# Patient Record
Sex: Male | Born: 1965 | Race: White | Hispanic: No | Marital: Married | Smoking: Current every day smoker
Health system: Southern US, Community
[De-identification: ages and names within clinical notes are randomized; demographics above are authoritative.]

## PROBLEM LIST (undated history)

## (undated) DIAGNOSIS — Z87442 Personal history of urinary calculi: Secondary | ICD-10-CM

## (undated) DIAGNOSIS — J189 Pneumonia, unspecified organism: Secondary | ICD-10-CM

## (undated) DIAGNOSIS — C801 Malignant (primary) neoplasm, unspecified: Secondary | ICD-10-CM

---

## 2000-10-30 ENCOUNTER — Ambulatory Visit (HOSPITAL_COMMUNITY): Admission: RE | Admit: 2000-10-30 | Discharge: 2000-10-30 | Payer: Self-pay | Admitting: Internal Medicine

## 2000-10-30 ENCOUNTER — Encounter: Payer: Self-pay | Admitting: Internal Medicine

## 2001-12-14 ENCOUNTER — Emergency Department (HOSPITAL_COMMUNITY): Admission: EM | Admit: 2001-12-14 | Discharge: 2001-12-15 | Payer: Self-pay | Admitting: *Deleted

## 2001-12-15 ENCOUNTER — Emergency Department (HOSPITAL_COMMUNITY): Admission: EM | Admit: 2001-12-15 | Discharge: 2001-12-15 | Payer: Self-pay | Admitting: Emergency Medicine

## 2003-12-31 ENCOUNTER — Emergency Department (HOSPITAL_COMMUNITY): Admission: EM | Admit: 2003-12-31 | Discharge: 2003-12-31 | Payer: Self-pay | Admitting: Emergency Medicine

## 2005-10-21 ENCOUNTER — Emergency Department (HOSPITAL_COMMUNITY): Admission: EM | Admit: 2005-10-21 | Discharge: 2005-10-21 | Payer: Self-pay | Admitting: Emergency Medicine

## 2005-12-18 ENCOUNTER — Emergency Department (HOSPITAL_COMMUNITY): Admission: EM | Admit: 2005-12-18 | Discharge: 2005-12-18 | Payer: Self-pay | Admitting: Emergency Medicine

## 2008-03-12 ENCOUNTER — Emergency Department (HOSPITAL_COMMUNITY): Admission: EM | Admit: 2008-03-12 | Discharge: 2008-03-12 | Payer: Self-pay | Admitting: Emergency Medicine

## 2008-05-01 ENCOUNTER — Emergency Department (HOSPITAL_COMMUNITY): Admission: EM | Admit: 2008-05-01 | Discharge: 2008-05-01 | Payer: Self-pay | Admitting: Emergency Medicine

## 2009-05-04 ENCOUNTER — Emergency Department (HOSPITAL_COMMUNITY): Admission: EM | Admit: 2009-05-04 | Discharge: 2009-05-04 | Payer: Self-pay | Admitting: Emergency Medicine

## 2010-10-25 LAB — POCT I-STAT, CHEM 8
BUN: 16 mg/dL (ref 6–23)
Calcium, Ion: 1.14 mmol/L (ref 1.12–1.32)
Chloride: 109 mEq/L (ref 96–112)
Creatinine, Ser: 0.7 mg/dL (ref 0.4–1.5)
Glucose, Bld: 127 mg/dL — ABNORMAL HIGH (ref 70–99)
HCT: 48 % (ref 39.0–52.0)
Hemoglobin: 16.3 g/dL (ref 13.0–17.0)
Potassium: 3.6 mEq/L (ref 3.5–5.1)
Sodium: 141 mEq/L (ref 135–145)
TCO2: 22 mmol/L (ref 0–100)

## 2010-10-25 LAB — URINALYSIS, ROUTINE W REFLEX MICROSCOPIC
Bilirubin Urine: NEGATIVE
Glucose, UA: NEGATIVE mg/dL
Ketones, ur: NEGATIVE mg/dL
Leukocytes, UA: NEGATIVE
Nitrite: NEGATIVE
Protein, ur: NEGATIVE mg/dL
Specific Gravity, Urine: >1.03 — ABNORMAL HIGH (ref 1.005–1.030)
Urobilinogen, UA: 1 mg/dL (ref 0.0–1.0)
pH: 6 (ref 5.0–8.0)

## 2010-10-25 LAB — URINE MICROSCOPIC-ADD ON

## 2011-04-22 LAB — STREP A DNA PROBE: Group A Strep Probe: NEGATIVE

## 2011-04-22 LAB — RAPID STREP SCREEN (MED CTR MEBANE ONLY): Streptococcus, Group A Screen (Direct): NEGATIVE

## 2013-04-03 ENCOUNTER — Encounter (HOSPITAL_COMMUNITY): Payer: Self-pay | Admitting: *Deleted

## 2013-04-03 ENCOUNTER — Emergency Department (HOSPITAL_COMMUNITY)
Admission: EM | Admit: 2013-04-03 | Discharge: 2013-04-03 | Disposition: A | Discharge status: Home / Self Care | Payer: Self-pay | Attending: Emergency Medicine | Admitting: Emergency Medicine

## 2013-04-03 DIAGNOSIS — B019 Varicella without complication: Secondary | ICD-10-CM | POA: Insufficient documentation

## 2013-04-03 DIAGNOSIS — M79609 Pain in unspecified limb: Secondary | ICD-10-CM | POA: Insufficient documentation

## 2013-04-03 DIAGNOSIS — F17200 Nicotine dependence, unspecified, uncomplicated: Secondary | ICD-10-CM | POA: Insufficient documentation

## 2013-04-03 MED ORDER — HYDROCODONE-ACETAMINOPHEN 5-325 MG PO TABS: 1.0000 | ORAL_TABLET | ORAL | Status: DC | PRN

## 2013-04-03 MED ORDER — VALACYCLOVIR HCL 1 G PO TABS: ORAL_TABLET | ORAL | Status: DC

## 2013-04-03 NOTE — ED Provider Notes (Signed)
CSN: [id number]     Arrival date & time 04/03/13  0913 History   First MD Initiated Contact with Patient 04/03/13 [phone]     Chief Complaint  Patient presents with  . Arm Pain  . Rash   (Consider location/radiation/quality/duration/timing/severity/associated sxs/prior Treatment) Patient is a 47 y.o. male presenting with arm pain and rash. The history is provided by the patient.  Arm Pain This is a new problem. The current episode started in the past 7 days. The problem occurs constantly. The problem has been gradually worsening. Associated symptoms include a rash. Pertinent negatives include no abdominal pain, chills, fever, headaches, nausea, neck pain, sore throat or vomiting. He has tried NSAIDs for the symptoms. The treatment provided no relief.  Rash Associated symptoms: no abdominal pain, no fever, no headaches, no nausea, no sore throat and not vomiting    Andre Donovan is a 47 y.o. male who presents to the ED with a rash. He states that a few days ago he felt a tingling sensation and then the next day noted a rash in the right axilla. Today the rash is slightly worse and the pain is increasing in the are of the rash. He describes the pain as burning, tingling.   History reviewed. No pertinent past medical history. History reviewed. No pertinent past surgical history. History reviewed. No pertinent family history. History  Substance Use Topics  . Smoking status: Current Every Day Smoker -- 2.00 packs/day  . Smokeless tobacco: Not on file  . Alcohol Use: No    Review of Systems  Constitutional: Negative for fever and chills.  HENT: Negative for sore throat and neck pain.   Gastrointestinal: Negative for nausea, vomiting and abdominal pain.  Genitourinary: Negative for urgency and frequency.  Musculoskeletal:       Right axilla pain and rash  Skin: Positive for rash.  Neurological: Negative for dizziness and headaches.  Psychiatric/Behavioral: The patient is not  nervous/anxious.     Allergies  Review of patient's allergies indicates no known allergies.  Home Medications  No current outpatient prescriptions on file. BP 126/88  Pulse 87  Temp(Src) 98.5 F (36.9 C) (Oral)  Resp 17  Ht 6' (1.829 m)  Wt 250 lb (113.399 kg)  BMI 33.9 kg/m2  SpO2 94% Physical Exam  Nursing note and vitals reviewed. Constitutional: He is oriented to person, place, and time. He appears well-developed and well-nourished. No distress.  HENT:  Head: Normocephalic and atraumatic.  Mouth/Throat: Uvula is midline, oropharynx is clear and moist and mucous membranes are normal.  Eyes: Conjunctivae and EOM are normal. Pupils are equal, round, and reactive to light.  Neck: Neck supple.  Cardiovascular: Normal rate, regular rhythm and normal heart sounds.   Pulmonary/Chest: Effort normal and breath sounds normal.  Abdominal: Soft. There is no tenderness.  Musculoskeletal: Normal range of motion.       Arms: There is vesicular rash that begins in the right axilla that extends to the right chest wall and down the right arm palmar aspect. Dermatome T1  Neurological: He is alert and oriented to person, place, and time. He has normal strength. No cranial nerve deficit or sensory deficit.  Skin: Skin is warm and dry.  Psychiatric: He has a normal mood and affect. His behavior is normal.    ED Course  Procedures  MDM  48 y.o. male with vesicular rash right chest, axilla and forearm consistent with herpes zoster Will treat with antiviral medications and pain management. Since patient has  some relief with Advil and only complains of slight burning will no treat with steroids since they are controversial. Discussed with the patient clinical findings and plan of care. All questioned fully answered. He will return if any problems arise. Patient stable for discharge without any immediate complications.     Medication List         HYDROcodone-acetaminophen 5-325 MG per tablet    Commonly known as:  NORCO/VICODIN  Take 1 tablet by mouth every 4 (four) hours as needed.     valACYclovir 1000 MG tablet  Commonly known as:  VALTREX  Take one 1,000 mg three times a day for 7 days           Janne Napoleon, NP 04/03/13 1022

## 2013-04-03 NOTE — ED Provider Notes (Signed)
Medical screening examination/treatment/procedure(s) were performed by non-physician practitioner and as supervising physician I was immediately available for consultation/collaboration. Devoria Albe, MD, Armando Gang   Ward Givens, MD 04/03/13 [phone]

## 2013-04-03 NOTE — ED Notes (Signed)
Two days ago began feeling tingling/stinging pain in R axilla and felt knot in axilla.  Yesterday broke out in rash.  Rash begins anterior R axilla, follows dermatomal pattern down inner aspect R arm.  Vesicles on reddened base, non-puritic.

## 2017-05-16 ENCOUNTER — Emergency Department (HOSPITAL_COMMUNITY)
Admission: EM | Admit: 2017-05-16 | Discharge: 2017-05-16 | Disposition: A | Discharge status: Home / Self Care | Payer: Self-pay | Attending: Emergency Medicine | Admitting: Emergency Medicine

## 2017-05-16 ENCOUNTER — Encounter (HOSPITAL_COMMUNITY): Payer: Self-pay | Admitting: Emergency Medicine

## 2017-05-16 DIAGNOSIS — Y9389 Activity, other specified: Secondary | ICD-10-CM | POA: Insufficient documentation

## 2017-05-16 DIAGNOSIS — Y999 Unspecified external cause status: Secondary | ICD-10-CM | POA: Insufficient documentation

## 2017-05-16 DIAGNOSIS — H109 Unspecified conjunctivitis: Secondary | ICD-10-CM

## 2017-05-16 DIAGNOSIS — H1031 Unspecified acute conjunctivitis, right eye: Secondary | ICD-10-CM | POA: Insufficient documentation

## 2017-05-16 DIAGNOSIS — X58XXXA Exposure to other specified factors, initial encounter: Secondary | ICD-10-CM | POA: Insufficient documentation

## 2017-05-16 DIAGNOSIS — Y9289 Other specified places as the place of occurrence of the external cause: Secondary | ICD-10-CM | POA: Insufficient documentation

## 2017-05-16 DIAGNOSIS — F17210 Nicotine dependence, cigarettes, uncomplicated: Secondary | ICD-10-CM | POA: Insufficient documentation

## 2017-05-16 MED ORDER — TETRACAINE HCL 0.5 % OP SOLN
1.0000 [drp] | Freq: Once | OPHTHALMIC | Status: AC
  Administered 2017-05-16: 1 [drp] via OPHTHALMIC

## 2017-05-16 MED ORDER — TOBRAMYCIN 0.3 % OP SOLN
2.0000 [drp] | Freq: Once | OPHTHALMIC | Status: AC
  Administered 2017-05-16: 2 [drp] via OPHTHALMIC
  Filled 2017-05-16: qty 5

## 2017-05-16 MED ORDER — TETRACAINE HCL 0.5 % OP SOLN
OPHTHALMIC | Status: AC
  Administered 2017-05-16: 12:00:00 1 [drp] via OPHTHALMIC
  Filled 2017-05-16: qty 4

## 2017-05-16 MED ORDER — FLUORESCEIN SODIUM 1 MG OP STRP
1.0000 | ORAL_STRIP | Freq: Once | OPHTHALMIC | Status: AC
  Administered 2017-05-16: 1 via OPHTHALMIC
  Filled 2017-05-16: qty 1

## 2017-05-16 NOTE — ED Triage Notes (Signed)
Patient states he was cleaning out gutters yesterday and feels like he got something in his right eye.

## 2017-05-16 NOTE — Discharge Instructions (Signed)
Vital signs within normal limits.  Your examination shows conjunctivitis/inflammation of your eye.  Please use cool compresses.  Please use dark glasses and a hat with a brim.  Try to avoid sudden light changes (light to dark, dark to light).  Please wash hands frequently.  This may be contagious, please keep your distance from others until it clears.  Use 2 drops of tobramycin to your right eye every 4 hours over the next 5 days.  Use the cool compresses 3 or 4 times daily.

## 2017-05-16 NOTE — ED Provider Notes (Signed)
[facility] EMERGENCY DEPARTMENT Provider Note   CSN: [id number] Arrival date & time: 05/16/17  1028     History   Chief Complaint Chief Complaint  Patient presents with  . Eye Injury    HPI Kateri PlummerBruce D Basso is a 51 y.o. male.  Patient is a 51 year old male who presents to the emergency department with a complaint of problem with his eye.  The patient states that on yesterday October 25 he was cleaning out a gutter when he felt like something went in his eye.  He states since that time he has been having increasing redness and irritation.  This morning he noted increased mucus in the eyelashes.  He states that he has some pain when he changes from light to dark areas.  No recent surgery or operations or procedures involving his eyes.  No loss of vision reported since this incident.  He presents now for assistance.      History reviewed. No pertinent past medical history.  There are no active problems to display for this patient.   History reviewed. No pertinent surgical history.     Home Medications    Prior to Admission medications   Medication Sig Start Date End Date Taking? Authorizing Provider  HYDROcodone-acetaminophen (NORCO/VICODIN) 5-325 MG per tablet Take 1 tablet by mouth every 4 (four) hours as needed. 04/03/13   Janne NapoleonNeese, Hope M, NP  valACYclovir (VALTREX) 1000 MG tablet Take one 1,000 mg three times a day for 7 days 04/03/13   Janne NapoleonNeese, Hope M, NP    Family History History reviewed. No pertinent family history.  Social History Social History  Substance Use Topics  . Smoking status: Current Every Day Smoker    Packs/day: 2.00  . Smokeless tobacco: Never Used  . Alcohol use Yes     Comment: occasionally     Allergies   Patient has no known allergies.   Review of Systems Review of Systems  Constitutional: Negative for activity change.       All ROS Neg except as noted in HPI  HENT: Negative for nosebleeds.   Eyes: Positive for photophobia,  discharge and redness.  Respiratory: Negative for cough, shortness of breath and wheezing.   Cardiovascular: Negative for chest pain and palpitations.  Gastrointestinal: Negative for abdominal pain and blood in stool.  Genitourinary: Negative for dysuria, frequency and hematuria.  Musculoskeletal: Negative for arthralgias, back pain and neck pain.  Skin: Negative.   Neurological: Negative for dizziness, seizures and speech difficulty.  Psychiatric/Behavioral: Negative for confusion and hallucinations.     Physical Exam Updated Vital Signs BP (!) 147/87 (BP Location: Right Arm)   Pulse 80   Temp 98.5 F (36.9 C) (Oral)   Resp 18   Ht 6' (1.829 m)   Wt 99.8 kg (220 lb)   SpO2 97%   BMI 29.84 kg/m   Physical Exam  Constitutional: He is oriented to person, place, and time. He appears well-developed and well-nourished.  Non-toxic appearance.  HENT:  Head: Normocephalic.  Right Ear: Tympanic membrane and external ear normal.  Left Ear: Tympanic membrane and external ear normal.  Eyes: Pupils are equal, round, and reactive to light. EOM and lids are normal. Lids are everted and swept, no foreign bodies found. Right eye exhibits no hordeolum. No foreign body present in the right eye. Left eye exhibits no hordeolum. No foreign body present in the left eye. Right conjunctiva is injected. Right conjunctiva has no hemorrhage. Left conjunctiva is not injected. Left conjunctiva has  no hemorrhage. No scleral icterus.  Fundoscopic exam:      The right eye shows no AV nicking, no hemorrhage and no papilledema.       The left eye shows no AV nicking, no hemorrhage and no papilledema.  Slit lamp exam:      The right eye shows no corneal flare, no corneal ulcer, no foreign body, no hyphema, no fluorescein uptake and no anterior chamber bulge.  Neck: Normal range of motion. Neck supple. Carotid bruit is not present.  Cardiovascular: Normal rate, regular rhythm, normal heart sounds, intact distal  pulses and normal pulses.   Pulmonary/Chest: Breath sounds normal. No respiratory distress.  Abdominal: Soft. Bowel sounds are normal. There is no tenderness. There is no guarding.  Musculoskeletal: Normal range of motion.  Lymphadenopathy:       Head (right side): No submandibular adenopathy present.       Head (left side): No submandibular adenopathy present.    He has no cervical adenopathy.  Neurological: He is alert and oriented to person, place, and time. He has normal strength. No cranial nerve deficit or sensory deficit.  Skin: Skin is warm and dry.  Psychiatric: He has a normal mood and affect. His speech is normal.  Nursing note and vitals reviewed.    ED Treatments / Results  Labs (all labs ordered are listed, but only abnormal results are displayed) Labs Reviewed - No data to display  EKG  EKG Interpretation None       Radiology No results found.  Procedures Procedures (including critical care time)  Medications Ordered in ED Medications  fluorescein ophthalmic strip 1 strip (1 strip Right Eye Given by Other 05/16/17 1145)  tetracaine (PONTOCAINE) 0.5 % ophthalmic solution 1 drop (1 drop Right Eye Given by Other 05/16/17 1143)  tobramycin (TOBREX) 0.3 % ophthalmic solution 2 drop (2 drops Right Eye Given 05/16/17 1202)     Initial Impression / Assessment and Plan / ED Course  I have reviewed the triage vital signs and the nursing notes.  Pertinent labs & imaging results that were available during my care of the patient were reviewed by me and considered in my medical decision making (see chart for details).       Final Clinical Impressions(s) / ED Diagnoses MDM Vital signs within normal limits.  Patient was examined with slit lamp.  No foreign body appreciated.  The bulbar conjunctiva is inflamed.  The conjunctiva is inflamed.  There is no anterior chamber bulge appreciated.  Suspect conjunctivitis.  Patient will be treated with cool compresses,  tobramycin eyedrops.  The patient is to see his ophthalmology specialist or return to the emergency department if not improving.   Final diagnoses:  None    New Prescriptions New Prescriptions   No medications on file     Ivery Quale, Cordelia Poche 05/16/17 1218    Samuel Jester, DO 05/20/17 1546

## 2018-02-13 ENCOUNTER — Encounter (HOSPITAL_COMMUNITY): Payer: Self-pay | Admitting: Emergency Medicine

## 2018-02-13 ENCOUNTER — Emergency Department (HOSPITAL_COMMUNITY)
Admission: EM | Admit: 2018-02-13 | Discharge: 2018-02-13 | Disposition: A | Discharge status: Home / Self Care | Payer: Self-pay | Attending: Emergency Medicine | Admitting: Emergency Medicine

## 2018-02-13 ENCOUNTER — Other Ambulatory Visit: Payer: Self-pay

## 2018-02-13 ENCOUNTER — Emergency Department (HOSPITAL_COMMUNITY): Payer: Self-pay

## 2018-02-13 DIAGNOSIS — N2889 Other specified disorders of kidney and ureter: Secondary | ICD-10-CM | POA: Insufficient documentation

## 2018-02-13 DIAGNOSIS — Z79899 Other long term (current) drug therapy: Secondary | ICD-10-CM | POA: Insufficient documentation

## 2018-02-13 DIAGNOSIS — F17200 Nicotine dependence, unspecified, uncomplicated: Secondary | ICD-10-CM | POA: Insufficient documentation

## 2018-02-13 DIAGNOSIS — N201 Calculus of ureter: Secondary | ICD-10-CM | POA: Insufficient documentation

## 2018-02-13 LAB — I-STAT CHEM 8, ED
BUN: 19 mg/dL (ref 6–20)
Calcium, Ion: 1.24 mmol/L (ref 1.15–1.40)
Chloride: 106 mmol/L (ref 98–111)
Creatinine, Ser: 1 mg/dL (ref 0.61–1.24)
Glucose, Bld: 151 mg/dL — ABNORMAL HIGH (ref 70–99)
HEMATOCRIT: 44 % (ref 39.0–52.0)
Hemoglobin: 15 g/dL (ref 13.0–17.0)
Potassium: 4 mmol/L (ref 3.5–5.1)
Sodium: 140 mmol/L (ref 135–145)
TCO2: 22 mmol/L (ref 22–32)

## 2018-02-13 MED ORDER — ONDANSETRON 8 MG PO TBDP: 8.0000 mg | ORAL_TABLET | Freq: Once | ORAL | Status: DC

## 2018-02-13 MED ORDER — ONDANSETRON HCL 4 MG/2ML IJ SOLN
4.0000 mg | Freq: Once | INTRAMUSCULAR | Status: AC
  Administered 2018-02-13: 4 mg via INTRAVENOUS
  Filled 2018-02-13: qty 2

## 2018-02-13 MED ORDER — TAMSULOSIN HCL 0.4 MG PO CAPS: ORAL_CAPSULE | ORAL | 0 refills | Status: DC

## 2018-02-13 MED ORDER — NAPROXEN 500 MG PO TABS: ORAL_TABLET | ORAL | 0 refills | Status: DC

## 2018-02-13 MED ORDER — KETOROLAC TROMETHAMINE 30 MG/ML IJ SOLN: 60.0000 mg | Freq: Once | INTRAMUSCULAR | Status: DC

## 2018-02-13 MED ORDER — KETOROLAC TROMETHAMINE 30 MG/ML IJ SOLN
30.0000 mg | Freq: Once | INTRAMUSCULAR | Status: AC
  Administered 2018-02-13: 30 mg via INTRAVENOUS
  Filled 2018-02-13: qty 1

## 2018-02-13 MED ORDER — ONDANSETRON HCL 4 MG PO TABS: 4.0000 mg | ORAL_TABLET | Freq: Three times a day (TID) | ORAL | 0 refills | Status: DC | PRN

## 2018-02-13 MED ORDER — OXYCODONE-ACETAMINOPHEN 5-325 MG PO TABS: 1.0000 | ORAL_TABLET | Freq: Four times a day (QID) | ORAL | 0 refills | Status: DC | PRN

## 2018-02-13 NOTE — ED Provider Notes (Signed)
[facility] EMERGENCY DEPARTMENT Provider Note   CSN: [id number] Arrival date & time: 02/13/18  0440  Time seen 05:24 AM   History   Chief Complaint Chief Complaint  Patient presents with  . Flank Pain    HPI Andre Donovan is a 52 y.o. male.  HPI patient reports about 3 AM he woke up with right-sided flank pain is radiating to his right mid abdomen.  He states the pain is constant and he describes it as a pressure.  He states nothing he does makes it feel worse, nothing he does makes it feel better.  He states he cannot find a comfortable position.  He has had nausea without vomiting.  He denies hematuria or dysuria.  He states he is never had this before.  He states he works out in the heat and he does drink a lot of caffeine drinks.  He denies any known family history of kidney stones.  PCP Patient, No Pcp Per   History reviewed. No pertinent past medical history.  There are no active problems to display for this patient.   History reviewed. No pertinent surgical history.      Home Medications    None  Prior to Admission medications   Medication Sig Start Date End Date Taking? Authorizing Provider  HYDROcodone-acetaminophen (NORCO/VICODIN) 5-325 MG per tablet Take 1 tablet by mouth every 4 (four) hours as needed. 04/03/13   Janne Napoleon, NP  naproxen (NAPROSYN) 500 MG tablet Take 1 po BID with food prn pain 02/13/18   Devoria Albe, MD  ondansetron (ZOFRAN) 4 MG tablet Take 1 tablet (4 mg total) by mouth every 8 (eight) hours as needed for nausea or vomiting. 02/13/18   Devoria Albe, MD  oxyCODONE-acetaminophen (PERCOCET/ROXICET) 5-325 MG tablet Take 1 tablet by mouth every 6 (six) hours as needed for severe pain. 02/13/18   Devoria Albe, MD  tamsulosin (FLOMAX) 0.4 MG CAPS capsule Take 1 po QD until you pass the stone. 02/13/18   Devoria Albe, MD  valACYclovir (VALTREX) 1000 MG tablet Take one 1,000 mg three times a day for 7 days 04/03/13   Janne Napoleon, NP    Family  History History reviewed. No pertinent family history.  Social History Social History   Tobacco Use  . Smoking status: Current Every Day Smoker    Packs/day: 2.00  . Smokeless tobacco: Never Used  Substance Use Topics  . Alcohol use: Yes    Comment: occasionally  . Drug use: Yes    Types: Marijuana  self employed   Allergies   Patient has no known allergies.   Review of Systems Review of Systems  All other systems reviewed and are negative.    Physical Exam Updated Vital Signs BP 136/88 (BP Location: Left Arm)   Pulse (!) 56   Temp (!) 97.4 F (36.3 C) (Oral)   Resp 17   Ht 6' (1.829 m)   Wt 104.3 kg (230 lb)   SpO2 95%   BMI 31.19 kg/m   Vital signs normal    Physical Exam  Constitutional: He is oriented to person, place, and time. He appears well-developed.  Appears uncomfortable, constantly changing position on the stretcher holding his right side  HENT:  Head: Normocephalic and atraumatic.  Right Ear: External ear normal.  Left Ear: External ear normal.  Nose: Nose normal.  Eyes: Conjunctivae and EOM are normal.  Neck: Neck supple.  Cardiovascular: Normal rate.  Pulmonary/Chest: Effort normal. No respiratory distress.  Abdominal:  Soft. Bowel sounds are normal. He exhibits no distension. There is no tenderness. There is no rebound and no guarding.  Musculoskeletal: Normal range of motion. He exhibits no deformity.  Neurological: He is alert and oriented to person, place, and time. No cranial nerve deficit.  Skin:  Patient skin is mildly damp, his color is pale  Psychiatric: His mood appears anxious. His speech is rapid and/or pressured. He is agitated.  Nursing note and vitals reviewed.    ED Treatments / Results  Labs (all labs ordered are listed, but only abnormal results are displayed) Results for orders placed or performed during the hospital encounter of 02/13/18  I-stat Chem 8, ED  Result Value Ref Range   Sodium 140 135 - 145 mmol/L    Potassium 4.0 3.5 - 5.1 mmol/L   Chloride 106 98 - 111 mmol/L   BUN 19 6 - 20 mg/dL   Creatinine, Ser 4.54 0.61 - 1.24 mg/dL   Glucose, Bld 098 (H) 70 - 99 mg/dL   Calcium, Ion 1.19 1.47 - 1.40 mmol/L   TCO2 22 22 - 32 mmol/L   Hemoglobin 15.0 13.0 - 17.0 g/dL   HCT 82.9 56.2 - 13.0 %   Laboratory interpretation all normal except hyperglycemia    EKG None  Radiology Ct Renal Stone Study  Result Date: 02/13/2018 CLINICAL DATA:  Right flank pain since 3 a.m. today.  No hematuria. EXAM: CT ABDOMEN AND PELVIS WITHOUT CONTRAST TECHNIQUE: Multidetector CT imaging of the abdomen and pelvis was performed following the standard protocol without IV contrast. COMPARISON:  None. FINDINGS: Lower chest: Lung bases are clear. Hepatobiliary: No focal liver abnormality is seen. No gallstones, gallbladder wall thickening, or biliary dilatation. Pancreas: Unremarkable. No pancreatic ductal dilatation or surrounding inflammatory changes. Spleen: Normal in size without focal abnormality. Adrenals/Urinary Tract: No adrenal gland nodules. 3 mm stone in the distal right ureter just above the ureterovesical junction. No significant hydronephrosis or hydroureter. No other stones identified. Bladder is decompressed. Small nodule on the upper pole of the right kidney measuring about 1.2 cm diameter appears solid. Stomach/Bowel: Stomach, small bowel, and colon are not abnormally distended. Scattered diverticula in the colon without evidence of diverticulitis. No wall thickening or inflammatory changes appreciated. Appendix is normal. Vascular/Lymphatic: Aortic atherosclerosis. No enlarged abdominal or pelvic lymph nodes. Reproductive: Prostate is unremarkable. Other: No abdominal wall hernia or abnormality. No abdominopelvic ascites. Musculoskeletal: Mild degenerative changes in the spine. No destructive bone lesions. IMPRESSION: 1. 3 mm stone in the distal right ureter without significant proximal obstruction. 2. 1.2 cm  diameter solid appearing nodule in the upper pole right kidney. Further characterization with renal protocol CT or MRI is suggested. 3. Mild aortic atherosclerosis. Electronically Signed   By: Burman Nieves M.D.   On: 02/13/2018 06:08    Procedures Procedures (including critical care time)  Medications Ordered in ED Medications  ketorolac (TORADOL) 30 MG/ML injection 30 mg (30 mg Intravenous Given 02/13/18 0535)  ondansetron (ZOFRAN) injection 4 mg (4 mg Intravenous Given 02/13/18 0535)     Initial Impression / Assessment and Plan / ED Course  I have reviewed the triage vital signs and the nursing notes.  Pertinent labs & imaging results that were available during my care of the patient were reviewed by me and considered in my medical decision making (see chart for details).     The way patient describes his discomfort in the way he is acting in the room he appears to be having a kidney stone  that he is trying to pass.  His last blood work in our system was 2010.  I-STAT 8 was done to make sure his kidney function was normal.  Patient was given Toradol for pain and CT of the abdomen and pelvis was done to look for suspected kidney stone.  Recheck at 6:55 AM patient states his pain is much improved.  We discussed his CT results showing that he is passing a stone, it is small and close to being passed into the bladder.  However we also discussed he had the 12 mm nodule in his right kidney.  I told him the concern would be this could be a cancer and stressed to him the importance to follow-up for further testing.  He was [facility] for his kidney stone and further evaluation of this nodule on his kidney.  Patient does not have a primary care doctor.  We also discussed his blood sugar was a little bit high, when he was seen in the ED in 2010 his CBG was a little bit high then also.  He states he has no family history of diabetes.  Final Clinical Impressions(s) / ED Diagnoses    Final diagnoses:  Right ureteral stone  Nodule of kidney    ED Discharge Orders        Ordered    oxyCODONE-acetaminophen (PERCOCET/ROXICET) 5-325 MG tablet  Every 6 hours PRN     02/13/18 0715    tamsulosin (FLOMAX) 0.4 MG CAPS capsule     02/13/18 0715    ondansetron (ZOFRAN) 4 MG tablet  Every 8 hours PRN     02/13/18 0715    naproxen (NAPROSYN) 500 MG tablet     02/13/18 0715      Plan discharge  Devoria AlbeIva Josephmichael Lisenbee, MD, Concha PyoFACEP    Mahonri Seiden, MD 02/13/18 [phone]

## 2018-02-13 NOTE — ED Triage Notes (Signed)
Pt c/o right flank pain since 0300.

## 2018-02-13 NOTE — Discharge Instructions (Addendum)
Drink plenty of fluids. Take the medications as prescribed. Return to the ED if you get fever or have uncontrolled vomiting or pain. Otherwise follow up [facility] about your kidney stone, you have a 3 mm stone in the right distal ureter (the tube that drains urine from your kidney to your bladder).  You also have a 12 mm nodule on your right kidney that you need to get further evaluation of to make sure it is a benign growth, it could be a cancer. Do not delay getting this evaluated.

## 2020-03-13 IMAGING — CT CT RENAL STONE PROTOCOL
2 of 4 series · 16 of 46 positions shown, 18 images · non-contrast
Comparison: None.

CLINICAL DATA: Right flank pain since 3 a.m. today.  No hematuria.

EXAM:
CT ABDOMEN AND PELVIS WITHOUT CONTRAST
TECHNIQUE: Multidetector CT imaging of the abdomen and pelvis was performed
following the standard protocol without IV contrast.

[Series 2: axial st · axial · 0.89mm/px · z∈[-635,-170]mm · 13 of 103 slices shown, 15 images]
[im 5/103  soft-tissue]
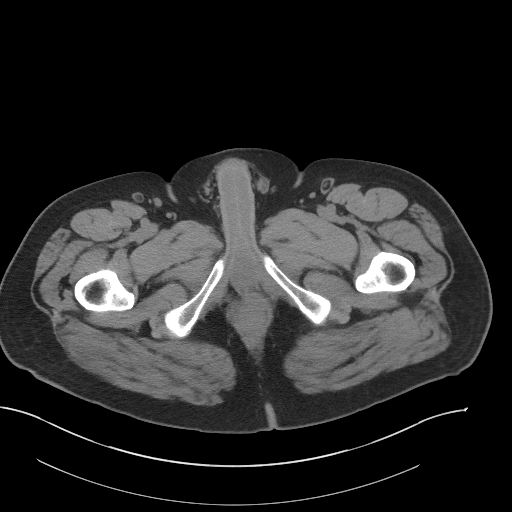
[im 5/103  bone]
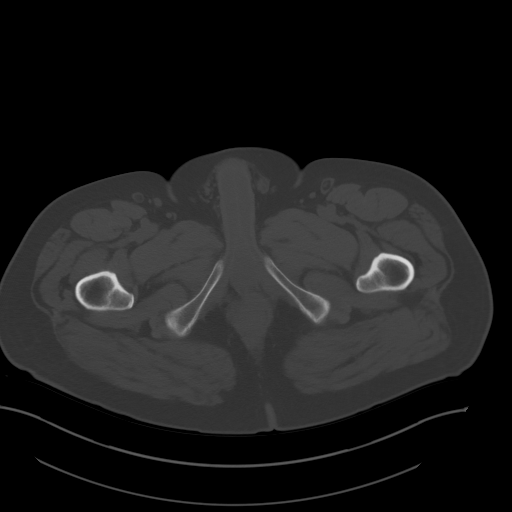
[im 14/103  soft-tissue]
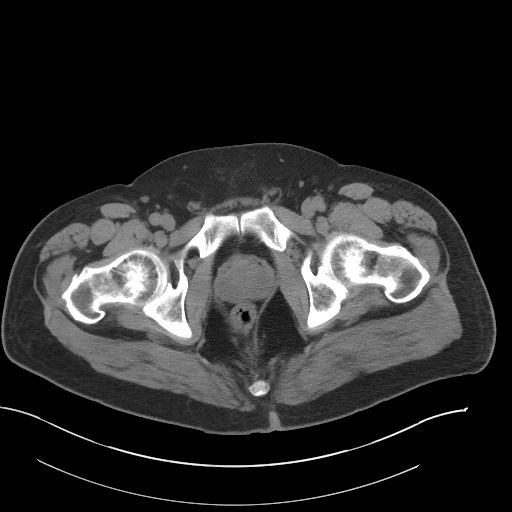
[im 23/103  soft-tissue]
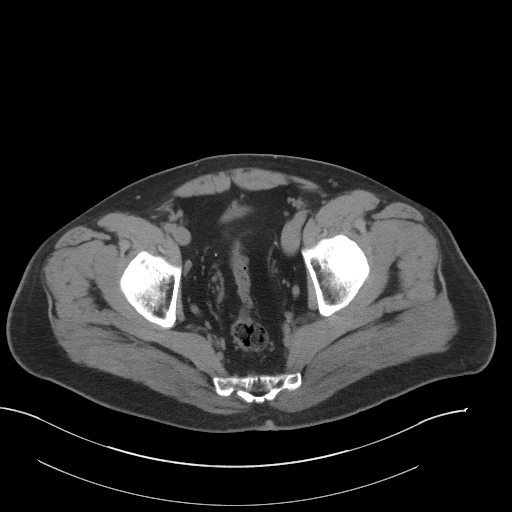
[im 27/103  soft-tissue]
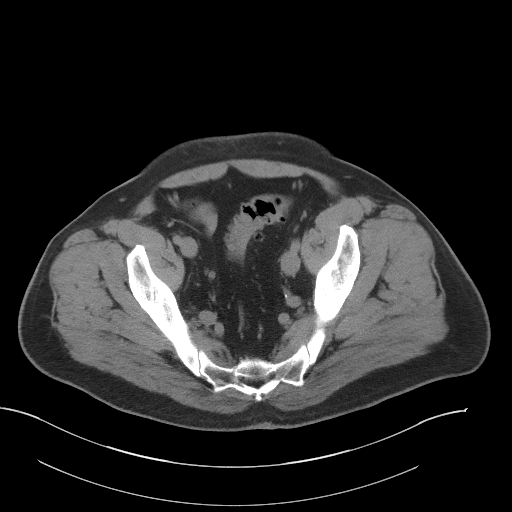
[im 36/103  soft-tissue]
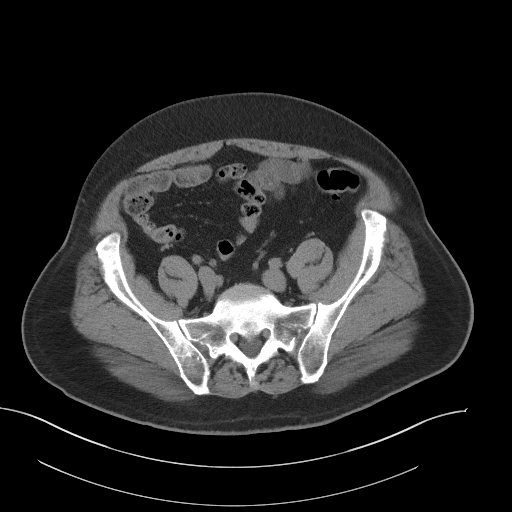
[im 45/103  soft-tissue]
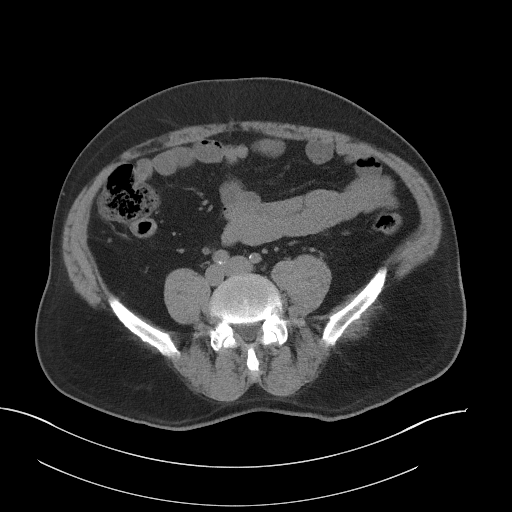
[im 54/103  soft-tissue]
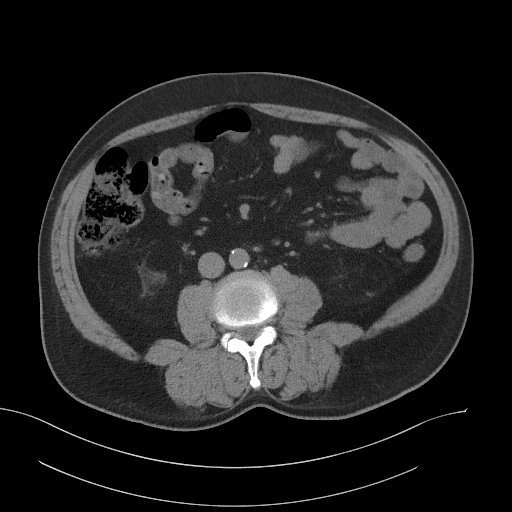
[im 58/103  soft-tissue]
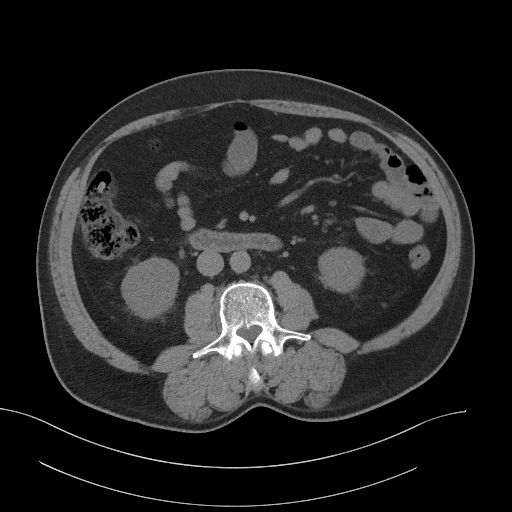
[im 67/103  soft-tissue]
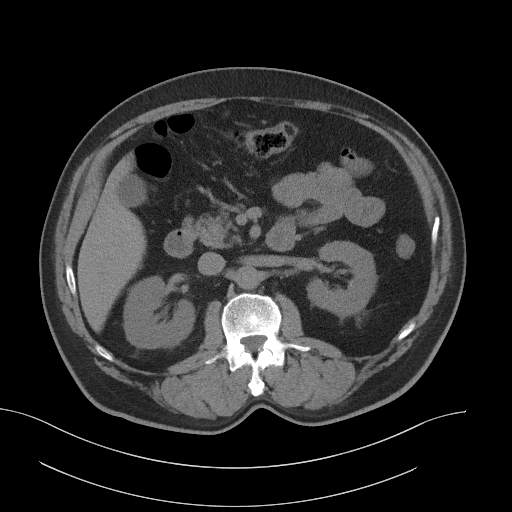
[im 67/103  bone]
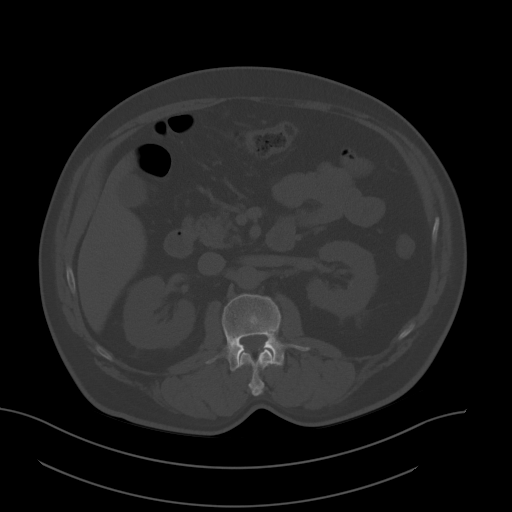
[im 76/103  soft-tissue]
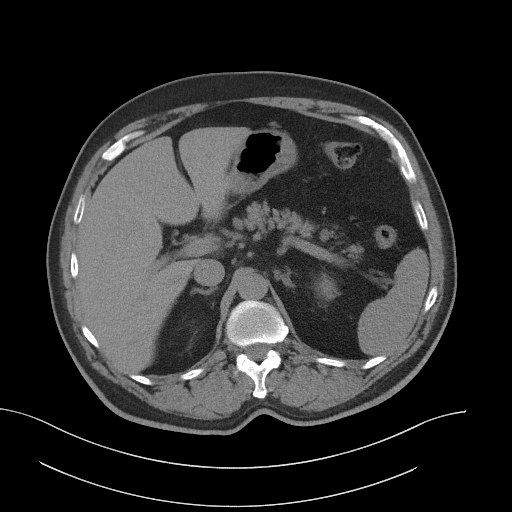
[im 80/103  soft-tissue]
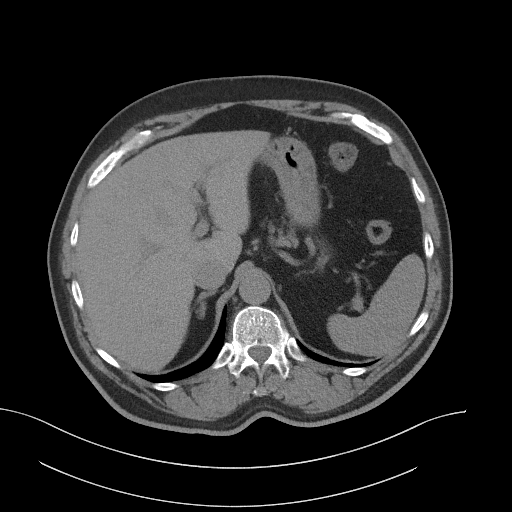
[im 89/103  soft-tissue]
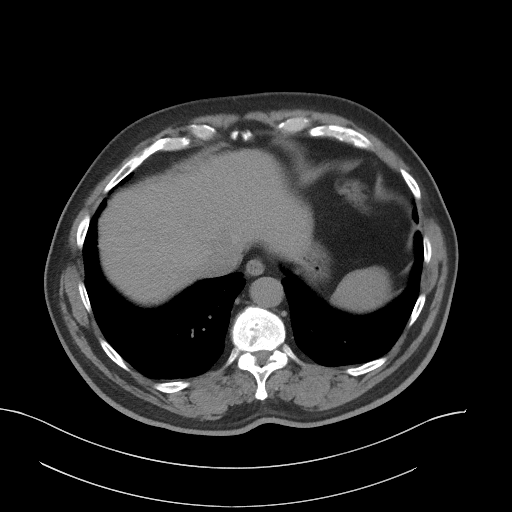
[im 98/103  soft-tissue]
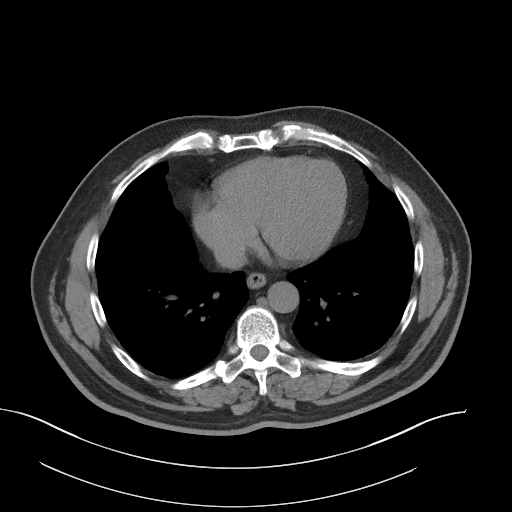

[Series 5: coronal st · coronal · 0.86mm/px · 3 of 109 slices shown]
[im 37/109  soft-tissue]
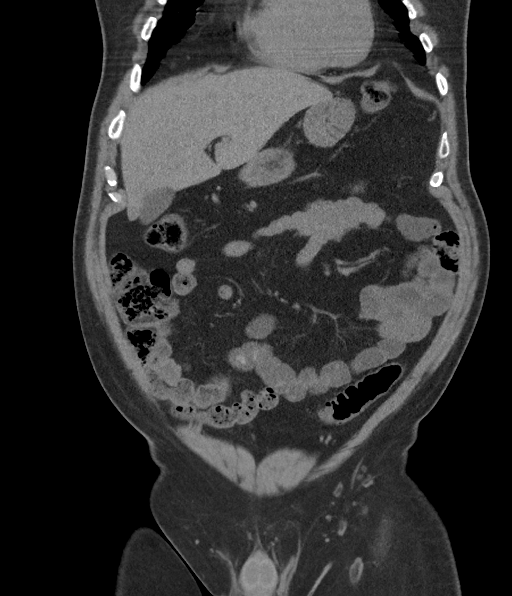
[im 49/109  soft-tissue]
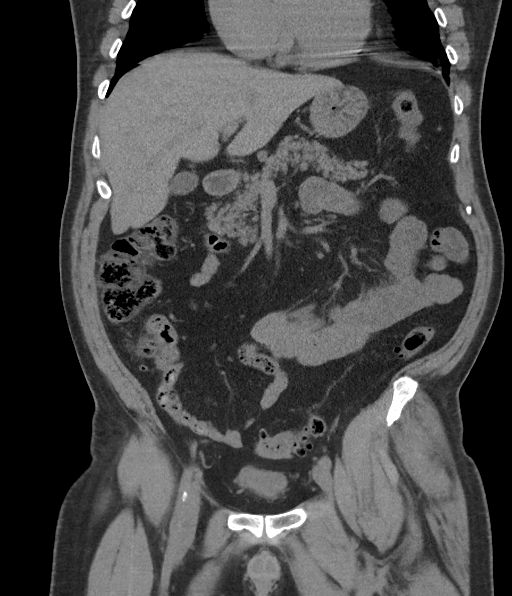
[im 61/109  soft-tissue]
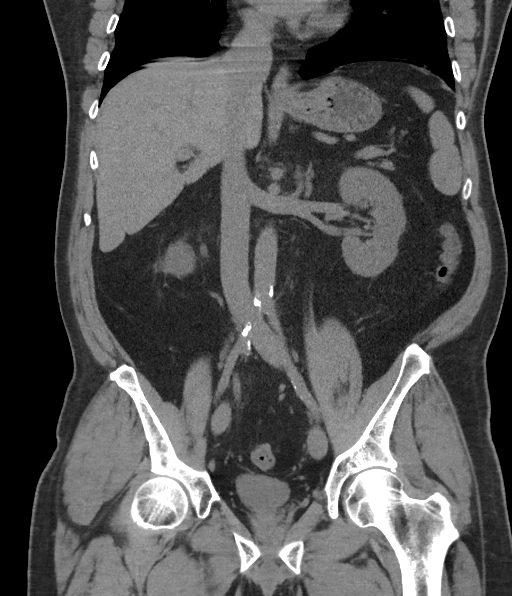

[16 of 46 positions shown; findings below may reference images not displayed]

FINDINGS: Lower chest: Lung bases are clear.

Hepatobiliary: No focal liver abnormality is seen. No gallstones,
gallbladder wall thickening, or biliary dilatation.

Pancreas: Unremarkable. No pancreatic ductal dilatation or
surrounding inflammatory changes.

Spleen: Normal in size without focal abnormality.

Adrenals/Urinary Tract: No adrenal gland nodules. 3 mm stone in the
distal right ureter just above the ureterovesical junction. No
significant hydronephrosis or hydroureter. No other stones
identified. Bladder is decompressed. Small nodule on the upper pole
of the right kidney measuring about 1.2 cm diameter appears solid.

Stomach/Bowel: Stomach, small bowel, and colon are not abnormally
distended. Scattered diverticula in the colon without evidence of
diverticulitis. No wall thickening or inflammatory changes
appreciated. Appendix is normal.

Vascular/Lymphatic: Aortic atherosclerosis. No enlarged abdominal or
pelvic lymph nodes.

Reproductive: Prostate is unremarkable.

Other: No abdominal wall hernia or abnormality. No abdominopelvic
ascites.

Musculoskeletal: Mild degenerative changes in the spine. No
destructive bone lesions.
IMPRESSION: 1. 3 mm stone in the distal right ureter without significant
proximal obstruction.
2. 1.2 cm diameter solid appearing nodule in the upper pole right
kidney. Further characterization with renal protocol CT or MRI is
suggested.
3. Mild aortic atherosclerosis.

## 2022-04-19 ENCOUNTER — Emergency Department (HOSPITAL_COMMUNITY)
Admission: EM | Admit: 2022-04-19 | Discharge: 2022-04-19 | Disposition: A | Discharge status: Home / Self Care | Payer: Self-pay | Attending: Emergency Medicine | Admitting: Emergency Medicine

## 2022-04-19 ENCOUNTER — Encounter (HOSPITAL_COMMUNITY): Payer: Self-pay | Admitting: Emergency Medicine

## 2022-04-19 DIAGNOSIS — K0889 Other specified disorders of teeth and supporting structures: Secondary | ICD-10-CM | POA: Insufficient documentation

## 2022-04-19 DIAGNOSIS — L539 Erythematous condition, unspecified: Secondary | ICD-10-CM | POA: Insufficient documentation

## 2022-04-19 DIAGNOSIS — J039 Acute tonsillitis, unspecified: Secondary | ICD-10-CM | POA: Insufficient documentation

## 2022-04-19 MED ORDER — CEPHALEXIN 500 MG PO CAPS: 500.0000 mg | ORAL_CAPSULE | Freq: Four times a day (QID) | ORAL | 0 refills | Status: DC

## 2022-04-19 MED ORDER — PREDNISONE 20 MG PO TABS
20.0000 mg | ORAL_TABLET | Freq: Once | ORAL | Status: AC
  Administered 2022-04-19: 20 mg via ORAL
  Filled 2022-04-19: qty 1

## 2022-04-19 MED ORDER — PREDNISONE 10 MG PO TABS: 20.0000 mg | ORAL_TABLET | Freq: Two times a day (BID) | ORAL | 0 refills | Status: DC

## 2022-04-19 MED ORDER — CEPHALEXIN 500 MG PO CAPS
500.0000 mg | ORAL_CAPSULE | Freq: Once | ORAL | Status: AC
  Administered 2022-04-19: 500 mg via ORAL
  Filled 2022-04-19: qty 1

## 2022-04-19 NOTE — ED Triage Notes (Signed)
Pt c/o left lower jaw pain and swelling for the past few days. Pt denies any recent dental problems.

## 2022-04-19 NOTE — Discharge Instructions (Signed)
Begin taking Keflex and prednisone as prescribed.  Follow-up with primary doctor if not improving, and return to the ER if symptoms significantly worsen or change.

## 2022-04-19 NOTE — ED Provider Notes (Signed)
[facility] EMERGENCY DEPARTMENT Provider Note   CSN: [id number] Arrival date & time: 04/19/22  4098     History  Chief Complaint  Patient presents with   Facial Swelling    Andre Donovan is a 56 y.o. male.  Patient is a 56 year old male with no significant past medical history.  Patient presenting today with complaints of sore throat and facial swelling.  This started 2 days ago.  He has swelling to the left jaw along with sore throat and dental discomfort.  He denies fevers or chills.  He denies difficulty breathing or swallowing.  He has taken some ibuprofen with little relief.  The history is provided by the patient.       Home Medications Prior to Admission medications   Medication Sig Start Date End Date Taking? Authorizing Provider  HYDROcodone-acetaminophen (NORCO/VICODIN) 5-325 MG per tablet Take 1 tablet by mouth every 4 (four) hours as needed. 04/03/13   Janne Napoleon, NP  naproxen (NAPROSYN) 500 MG tablet Take 1 po BID with food prn pain 02/13/18   Devoria Albe, MD  ondansetron (ZOFRAN) 4 MG tablet Take 1 tablet (4 mg total) by mouth every 8 (eight) hours as needed for nausea or vomiting. 02/13/18   Devoria Albe, MD  oxyCODONE-acetaminophen (PERCOCET/ROXICET) 5-325 MG tablet Take 1 tablet by mouth every 6 (six) hours as needed for severe pain. 02/13/18   Devoria Albe, MD  tamsulosin (FLOMAX) 0.4 MG CAPS capsule Take 1 po QD until you pass the stone. 02/13/18   Devoria Albe, MD  valACYclovir (VALTREX) 1000 MG tablet Take one 1,000 mg three times a day for 7 days 04/03/13   Janne Napoleon, NP      Allergies    Patient has no known allergies.    Review of Systems   Review of Systems  All other systems reviewed and are negative.   Physical Exam Updated Vital Signs BP (!) 150/92 (BP Location: Left Arm)   Pulse 86   Temp 97.8 F (36.6 C) (Oral)   Resp 17   Ht 6' (1.829 m)   Wt 108.9 kg   SpO2 97%   BMI 32.55 kg/m  Physical Exam Vitals and nursing note reviewed.   Constitutional:      General: He is not in acute distress.    Appearance: Normal appearance. He is not ill-appearing.  HENT:     Head: Normocephalic and atraumatic.     Mouth/Throat:     Mouth: Mucous membranes are moist.     Pharynx: Posterior oropharyngeal erythema present. No oropharyngeal exudate.  Neck:     Comments: There is some tenderness noted to the submandibular soft tissues on the left. Pulmonary:     Effort: Pulmonary effort is normal.  Musculoskeletal:     Cervical back: Normal range of motion and neck supple. Tenderness present.  Skin:    General: Skin is warm and dry.  Neurological:     Mental Status: He is alert and oriented to person, place, and time.     ED Results / Procedures / Treatments   Labs (all labs ordered are listed, but only abnormal results are displayed) Labs Reviewed - No data to display  EKG None  Radiology No results found.  Procedures Procedures    Medications Ordered in ED Medications  cephALEXin (KEFLEX) capsule 500 mg (has no administration in time range)  predniSONE (DELTASONE) tablet 20 mg (has no administration in time range)    ED Course/ Medical Decision Making/ A&P  Patient to be treated with Keflex and prednisone for what appears to be tonsillitis.  To return as needed for any problems.  Final Clinical Impression(s) / ED Diagnoses Final diagnoses:  None    Rx / DC Orders ED Discharge Orders     None         Veryl Speak, MD 04/19/22 [phone]

## 2023-04-27 ENCOUNTER — Other Ambulatory Visit: Payer: Self-pay

## 2023-04-27 ENCOUNTER — Emergency Department (HOSPITAL_COMMUNITY): Payer: Self-pay

## 2023-04-27 ENCOUNTER — Emergency Department (HOSPITAL_COMMUNITY)
Admission: EM | Admit: 2023-04-27 | Discharge: 2023-04-28 | Disposition: A | Discharge status: Home / Self Care | Payer: Self-pay | Attending: Emergency Medicine | Admitting: Emergency Medicine

## 2023-04-27 ENCOUNTER — Encounter (HOSPITAL_COMMUNITY): Payer: Self-pay

## 2023-04-27 DIAGNOSIS — J189 Pneumonia, unspecified organism: Secondary | ICD-10-CM | POA: Insufficient documentation

## 2023-04-27 DIAGNOSIS — R531 Weakness: Secondary | ICD-10-CM | POA: Insufficient documentation

## 2023-04-27 LAB — COMPREHENSIVE METABOLIC PANEL
ALT: 27 U/L (ref 0–44)
AST: 25 U/L (ref 15–41)
Albumin: 3.3 g/dL — ABNORMAL LOW (ref 3.5–5.0)
Alkaline Phosphatase: 78 U/L (ref 38–126)
Anion gap: 8 (ref 5–15)
BUN: 13 mg/dL (ref 6–20)
CO2: 24 mmol/L (ref 22–32)
Calcium: 9.1 mg/dL (ref 8.9–10.3)
Chloride: 107 mmol/L (ref 98–111)
Creatinine, Ser: 0.8 mg/dL (ref 0.61–1.24)
GFR, Estimated: >60 mL/min (ref >60)
Glucose, Bld: 97 mg/dL (ref 70–99)
Potassium: 4.2 mmol/L (ref 3.5–5.1)
Sodium: 139 mmol/L (ref 135–145)
Total Bilirubin: 0.4 mg/dL (ref 0.3–1.2)
Total Protein: 6.7 g/dL (ref 6.5–8.1)

## 2023-04-27 LAB — URINALYSIS, ROUTINE W REFLEX MICROSCOPIC
Bilirubin Urine: NEGATIVE
Glucose, UA: NEGATIVE mg/dL
Hgb urine dipstick: NEGATIVE
Ketones, ur: NEGATIVE mg/dL
Leukocytes,Ua: NEGATIVE
Nitrite: NEGATIVE
Protein, ur: NEGATIVE mg/dL
Specific Gravity, Urine: 1.018 (ref 1.005–1.030)
pH: 6 (ref 5.0–8.0)

## 2023-04-27 LAB — CBG MONITORING, ED: Glucose-Capillary: 102 mg/dL — ABNORMAL HIGH (ref 70–99)

## 2023-04-27 LAB — CBC WITH DIFFERENTIAL/PLATELET
Abs Immature Granulocytes: 0.05 10*3/uL (ref 0.00–0.07)
Basophils Absolute: 0.1 10*3/uL (ref 0.0–0.1)
Basophils Relative: 1 %
Eosinophils Absolute: 0.2 10*3/uL (ref 0.0–0.5)
Eosinophils Relative: 2 %
HCT: 47.5 % (ref 39.0–52.0)
Hemoglobin: 16.1 g/dL (ref 13.0–17.0)
Immature Granulocytes: 1 %
Lymphocytes Relative: 26 %
Lymphs Abs: 2.7 10*3/uL (ref 0.7–4.0)
MCH: 31.4 pg (ref 26.0–34.0)
MCHC: 33.9 g/dL (ref 30.0–36.0)
MCV: 92.8 fL (ref 80.0–100.0)
Monocytes Absolute: 0.8 10*3/uL (ref 0.1–1.0)
Monocytes Relative: 8 %
Neutro Abs: 6.5 10*3/uL (ref 1.7–7.7)
Neutrophils Relative %: 62 %
Platelets: 296 10*3/uL (ref 150–400)
RBC: 5.12 MIL/uL (ref 4.22–5.81)
RDW: 12.1 % (ref 11.5–15.5)
WBC: 10.4 10*3/uL (ref 4.0–10.5)
nRBC: 0 % (ref 0.0–0.2)

## 2023-04-27 MED ORDER — SODIUM CHLORIDE 0.9 % IV BOLUS
1000.0000 mL | Freq: Once | INTRAVENOUS | Status: AC
  Administered 2023-04-27: 1000 mL via INTRAVENOUS

## 2023-04-27 MED ORDER — IOHEXOL 300 MG/ML  SOLN
75.0000 mL | Freq: Once | INTRAMUSCULAR | Status: AC | PRN
  Administered 2023-04-27: 75 mL via INTRAVENOUS

## 2023-04-27 NOTE — ED Notes (Signed)
Dr Criss Alvine at bedside. Kellogg RN

## 2023-04-27 NOTE — ED Triage Notes (Addendum)
C/o Nausea, diarrhea, chills, weakness, dizziness, non productive cough x 2 weeks. Pt reports 1 week before having symptoms had got a puppy and it had worms but was treated.

## 2023-04-27 NOTE — ED Provider Notes (Signed)
[place] EMERGENCY DEPARTMENT AT [facility] Provider Note   CSN: [id number] Arrival date & time: 04/27/23  2015     History  Chief Complaint  Patient presents with   Weakness    Andre Donovan is a 57 y.o. male.  HPI 57 year old male presents with generalized weakness and fatigue.  2 weeks ago he started having flulike symptoms with cough, chills and 1 day of diarrhea and some nausea.  The cough has dramatically improved but not all the way gone.  He does smoke.  However he states the fatigue that started with those symptoms never went away despite the other flulike symptoms going away.  He denies headache, focal weakness or numbness, trouble breathing, chest pain, abdominal pain, or other acute symptoms.  He just feels tired and weak.  He denies a history of hypertension or diabetes.  Home Medications Prior to Admission medications   Medication Sig Start Date End Date Taking? Authorizing Provider  cephALEXin (KEFLEX) 500 MG capsule Take 1 capsule (500 mg total) by mouth 4 (four) times daily. 04/19/22   Geoffery Lyons, MD  HYDROcodone-acetaminophen (NORCO/VICODIN) 5-325 MG per tablet Take 1 tablet by mouth every 4 (four) hours as needed. 04/03/13   Janne Napoleon, NP  naproxen (NAPROSYN) 500 MG tablet Take 1 po BID with food prn pain 02/13/18   Devoria Albe, MD  ondansetron (ZOFRAN) 4 MG tablet Take 1 tablet (4 mg total) by mouth every 8 (eight) hours as needed for nausea or vomiting. 02/13/18   Devoria Albe, MD  oxyCODONE-acetaminophen (PERCOCET/ROXICET) 5-325 MG tablet Take 1 tablet by mouth every 6 (six) hours as needed for severe pain. 02/13/18   Devoria Albe, MD  predniSONE (DELTASONE) 10 MG tablet Take 2 tablets (20 mg total) by mouth 2 (two) times daily. 04/19/22   Geoffery Lyons, MD  tamsulosin (FLOMAX) 0.4 MG CAPS capsule Take 1 po QD until you pass the stone. 02/13/18   Devoria Albe, MD  valACYclovir (VALTREX) 1000 MG tablet Take one 1,000 mg three times a day for 7 days 04/03/13    Janne Napoleon, NP      Allergies    Patient has no known allergies.    Review of Systems   Review of Systems  Constitutional:  Positive for chills and fatigue.  Respiratory:  Positive for cough. Negative for shortness of breath.   Cardiovascular:  Negative for chest pain.  Gastrointestinal:  Positive for diarrhea (none now). Negative for abdominal pain.  Genitourinary:  Negative for dysuria.  Neurological:  Positive for weakness. Negative for headaches.    Physical Exam Updated Vital Signs BP 130/82   Pulse 63   Temp 98.1 F (36.7 C) (Oral)   Resp 16   Ht 6' (1.829 m)   Wt 104.3 kg   SpO2 95%   BMI 31.19 kg/m  Physical Exam Vitals and nursing note reviewed.  Constitutional:      Appearance: He is well-developed.  HENT:     Head: Normocephalic and atraumatic.  Eyes:     Extraocular Movements: Extraocular movements intact.     Pupils: Pupils are equal, round, and reactive to light.  Cardiovascular:     Rate and Rhythm: Normal rate and regular rhythm.     Heart sounds: Normal heart sounds.  Pulmonary:     Effort: Pulmonary effort is normal.     Breath sounds: Normal breath sounds.  Abdominal:     Palpations: Abdomen is soft.     Tenderness: There is  no abdominal tenderness.  Skin:    General: Skin is warm and dry.  Neurological:     Mental Status: He is alert.     Comments: CN 3-12 grossly intact. 5/5 strength in all 4 extremities. Grossly normal sensation. Normal finger to nose. Normal gait     ED Results / Procedures / Treatments   Labs (all labs ordered are listed, but only abnormal results are displayed) Labs Reviewed  COMPREHENSIVE METABOLIC PANEL - Abnormal; Notable for the following components:      Result Value   Albumin 3.3 (*)    All other components within normal limits  CBG MONITORING, ED - Abnormal; Notable for the following components:   Glucose-Capillary 102 (*)    All other components within normal limits  URINALYSIS, ROUTINE W REFLEX  MICROSCOPIC  CBC WITH DIFFERENTIAL/PLATELET    EKG EKG Interpretation Date/Time:  Sunday April 27 2023 22:18:48 EDT Ventricular Rate:  63 PR Interval:  138 QRS Duration:  85 QT Interval:  411 QTC Calculation: 421 R Axis:   55  Text Interpretation: Sinus rhythm no acute ST/T changes No old tracing to compare Confirmed by Pricilla Loveless [phone]) on 04/27/2023 10:24:24 PM  Radiology DG Chest 2 View  Result Date: 04/27/2023 CLINICAL DATA:  Cough, weak miss EXAM: CHEST - 2 VIEW COMPARISON:  None Available. FINDINGS: Platelike atelectasis within the right mid lung zone. This demonstrates more nodular contour within the superior segment of the right lower lobe and an underlying pulmonary nodule is difficult to exclude. No pneumothorax or pleural effusion. Cardiac size is within normal limits. Pulmonary vascularity is normal. No acute bone abnormality. IMPRESSION: 1. Platelike atelectasis within the right mid lung zone. More nodular contour within the superior segment of the right lower lobe and an underlying pulmonary nodule is difficult to exclude. Follow-up chest radiograph is recommended in 3-4 weeks to document resolution. If persistent, contrast enhanced CT examination is recommended at that time to exclude an underlying mass lesion. Electronically Signed   By: Helyn Numbers M.D.   On: 04/27/2023 22:35    Procedures Procedures    Medications Ordered in ED Medications  sodium chloride 0.9 % bolus 1,000 mL (1,000 mLs Intravenous New Bag/Given 04/27/23 2143)    ED Course/ Medical Decision Making/ A&P                                 Medical Decision Making Amount and/or Complexity of Data Reviewed Labs: ordered.    Details: Unremarkable labs including normal WBC, unremarkable electrolytes, negative urine. Radiology: ordered and independent interpretation performed.    Details: Linear opacity on x-ray ECG/medicine tests: ordered and independent interpretation performed.    Details:  No ischemia   No clear cause for the patient's weakness.  Could be a postviral.  No ischemia noted or chest pain.  Highly doubt myocarditis or heart emergency.  His chest x-ray shows concern for nodular opacity and he is a smoker but does not have a PCP.  I think is reasonable to go ahead and get a CT now to rule out cannot care.  Care transferred to Dr. Pilar Plate.        Final Clinical Impression(s) / ED Diagnoses Final diagnoses:  None    Rx / DC Orders ED Discharge Orders     None         Pricilla Loveless, MD 04/27/23 2336

## 2023-04-27 NOTE — ED Provider Notes (Signed)
  Provider Note MRN:  [id number]  Arrival date & time: 04/28/23    ED Course and Medical Decision Making  Assumed care from Dr. Criss Alvine at shift change.  Cough, abnormal chest x-ray will follow-up with CT chest.  1:30 AM update: Chest x-ray reveals likely pneumonia, also evidence of heart disease, also evidence of mass to the right kidney.  Patient made aware of these findings, he looks well and has normal vital signs, no real indication for admission at this time.  Will try to set him up with good follow-up, treat the community-acquired pneumonia.  Procedures  Final Clinical Impressions(s) / ED Diagnoses     ICD-10-CM   1. Weakness  R53.1     2. Community acquired pneumonia of right upper lobe of lung  J18.9       ED Discharge Orders          Ordered    cefdinir (OMNICEF) 300 MG capsule  2 times daily        04/28/23 0139    azithromycin (ZITHROMAX) 250 MG tablet  Daily        04/28/23 0139              Discharge Instructions      You were evaluated in the Emergency Department and after careful evaluation, we did not find any emergent condition requiring admission or further testing in the hospital.  Your testing showed evidence of pneumonia.  Take the cefdinir and azithromycin antibiotics as directed.  Very important to establish with a primary care doctor.  Use [email or web address] to find a a doctor.  Your CT showed some evidence of heart disease and a dilation of your aorta.  Recommend follow-up with cardiology.  Your CT scan also showed a mass on your kidney.  As we discussed, this could be cancer and requires close follow-up.  Call Dr. Marice Potter office for follow-up.  If you are unable to find any follow-up within the next week or 2, please return to the emergency department for help.  Please return to the Emergency Department if you experience any worsening of your condition.   Thank you for allowing [place] to be a part of your care.      Elmer Sow.  Pilar Plate, MD [facility] Emergency Medicine [facility] [email or web address]    Sabas Sous, MD 04/28/23 [phone]

## 2023-04-28 MED ORDER — AZITHROMYCIN 250 MG PO TABS: 250.0000 mg | ORAL_TABLET | Freq: Every day | ORAL | 0 refills | Status: AC

## 2023-04-28 MED ORDER — CEFDINIR 300 MG PO CAPS
300.0000 mg | ORAL_CAPSULE | Freq: Two times a day (BID) | ORAL | Status: DC
  Administered 2023-04-28: 300 mg via ORAL
  Filled 2023-04-28: qty 1

## 2023-04-28 MED ORDER — AZITHROMYCIN 250 MG PO TABS
500.0000 mg | ORAL_TABLET | Freq: Once | ORAL | Status: AC
  Administered 2023-04-28: 500 mg via ORAL
  Filled 2023-04-28: qty 2

## 2023-04-28 MED ORDER — CEFDINIR 300 MG PO CAPS: 300.0000 mg | ORAL_CAPSULE | Freq: Two times a day (BID) | ORAL | 0 refills | Status: AC

## 2023-04-28 NOTE — Discharge Instructions (Addendum)
You were evaluated in the Emergency Department and after careful evaluation, we did not find any emergent condition requiring admission or further testing in the hospital.  Your testing showed evidence of pneumonia.  Take the cefdinir and azithromycin antibiotics as directed.  Very important to establish with a primary care doctor.  Use [email or web address] to find a a doctor.  Your CT showed some evidence of heart disease and a dilation of your aorta.  Recommend follow-up with cardiology.  Your CT scan also showed a mass on your kidney.  As we discussed, this could be cancer and requires close follow-up.  Call Dr. Marice Potter office for follow-up.  If you are unable to find any follow-up within the next week or 2, please return to the emergency department for help.  Please return to the Emergency Department if you experience any worsening of your condition.   Thank you for allowing [place] to be a part of your care.

## 2023-05-11 NOTE — Progress Notes (Signed)
[facility] [address] [place] [address]   Clinic Day:  05/12/2023  Referring physician: No ref. provider found  Patient Care Team: Patient, No Pcp Per as PCP - General (General Practice)   ASSESSMENT & PLAN:   Assessment:  1.  Right kidney mass: - Patient seen in the ER on 04/27/2023 with weakness and was diagnosed with pneumonia. - CT chest (04/27/2023): Incidental heterogeneously enhancing 3.5 cm mass within the visualized upper pole of the right kidney, incompletely included in the exam suspicious for primary renal neoplasm. - No hematuria/B symptoms.  2.  Social/family history: - Lives at home with his wife.  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  Has exposure to paints.  Current active smoker, 2 pack/day, started at age 45. - Mother died of rare type of cancer.  Type unknown to the patient.  Plan:  1.  Right kidney mass: - I have reviewed images of the CT scan with the patient and his wife in detail.  He has 3.5 cm upper pole of the right kidney mass, previously 1.2 cm in 2019. - This is highly suspicious for RCC. - Recommend MRI of the abdomen and pelvis with and without contrast. - If no evidence of metastatic disease, will refer to urology.   Orders Placed This Encounter  Procedures   MR ABDOMEN WWO CONTRAST    Standing Status:   Future    Standing Expiration Date:   05/11/2024    Order Specific Question:   If indicated for the ordered procedure, I authorize the administration of contrast media per Radiology protocol    Answer:   Yes    Order Specific Question:   What is the patient's sedation requirement?    Answer:   No Sedation    Order Specific Question:   Does the patient have a pacemaker or implanted devices?    Answer:   No    Order Specific Question:   Preferred imaging location?    Answer:   [facility] (table limit [phone])   MR PELVIS W WO CONTRAST    Standing Status:   Future    Standing Expiration Date:    05/11/2024    Order Specific Question:   If indicated for the ordered procedure, I authorize the administration of contrast media per Radiology protocol    Answer:   Yes    Order Specific Question:   What is the patient's sedation requirement?    Answer:   No Sedation    Order Specific Question:   Does the patient have a pacemaker or implanted devices?    Answer:   No    Order Specific Question:   Preferred imaging location?    Answer:   [facility] (table limit - 550lbs)      I,Katie Daubenspeck,acting as a scribe for Doreatha Massed, MD.,have documented all relevant documentation on the behalf of Doreatha Massed, MD,as directed by  Doreatha Massed, MD while in the presence of Doreatha Massed, MD.   I, Doreatha Massed MD, have reviewed the above documentation for accuracy and completeness, and I agree with the above.   Doreatha Massed, MD   10/21/20243:54 PM  CHIEF COMPLAINT/PURPOSE OF CONSULT:   Diagnosis: right kidney mass   Cancer Staging  No matching staging information was found for the patient.    Prior Therapy: none  Current Therapy: Under workup   HISTORY OF PRESENT ILLNESS:   Oncology History   No history exists.  Andre Donovan is a 57 y.o. male presenting to clinic today for evaluation of right kidney mass at the request of Dr. Pilar Plate (hospitalist).  He presented to the ED on 04/27/23 with persistent fatigue, generalized weakness, and cough. Work up with chest CT revealed: evidence of atypical infection throughout RUL and superior RLL; enhancing 3.5 cm mass within visualized upper pole of right kidney, suspicious for a primary renal neoplasm.   Of note, a 1.2 cm nodule in the upper right kidney was previously seen on renal stone study CT in 01/2018.  Today, he states that he is doing well overall. His appetite level is at 85%. His energy level is at 100%.  PAST MEDICAL HISTORY:   Past Medical History: No past medical history on  file.  Surgical History: No past surgical history on file.  Social History: Social History   Socioeconomic History   Marital status: Married    Spouse name: Not on file   Number of children: Not on file   Years of education: Not on file   Highest education level: Not on file  Occupational History   Not on file  Tobacco Use   Smoking status: Every Day    Current packs/day: 2.00    Types: Cigarettes   Smokeless tobacco: Never  Substance and Sexual Activity   Alcohol use: Yes    Comment: occasionally   Drug use: Yes    Types: Marijuana   Sexual activity: Not on file    Comment: rare  Other Topics Concern   Not on file  Social History Narrative   Not on file   Social Determinants of Health   Financial Resource Strain: Not on file  Food Insecurity: Not on file  Transportation Needs: Not on file  Physical Activity: Not on file  Stress: Not on file  Social Connections: Not on file  Intimate Partner Violence: Not on file    Family History: No family history on file.  Current Medications:  Current Outpatient Medications:    HYDROcodone-acetaminophen (NORCO/VICODIN) 5-325 MG per tablet, Take 1 tablet by mouth every 4 (four) hours as needed., Disp: 15 tablet, Rfl: 0   naproxen (NAPROSYN) 500 MG tablet, Take 1 po BID with food prn pain, Disp: 20 tablet, Rfl: 0   ondansetron (ZOFRAN) 4 MG tablet, Take 1 tablet (4 mg total) by mouth every 8 (eight) hours as needed for nausea or vomiting., Disp: 10 tablet, Rfl: 0   oxyCODONE-acetaminophen (PERCOCET/ROXICET) 5-325 MG tablet, Take 1 tablet by mouth every 6 (six) hours as needed for severe pain., Disp: 12 tablet, Rfl: 0   predniSONE (DELTASONE) 10 MG tablet, Take 2 tablets (20 mg total) by mouth 2 (two) times daily., Disp: 20 tablet, Rfl: 0   tamsulosin (FLOMAX) 0.4 MG CAPS capsule, Take 1 po QD until you pass the stone., Disp: 10 capsule, Rfl: 0   valACYclovir (VALTREX) 1000 MG tablet, Take one 1,000 mg three times a day for 7  days, Disp: 21 tablet, Rfl: 0   Allergies: No Known Allergies  REVIEW OF SYSTEMS:   Review of Systems  Constitutional:  Negative for chills, fatigue and fever.  HENT:   Negative for lump/mass, mouth sores, nosebleeds, sore throat and trouble swallowing.   Eyes:  Negative for eye problems.  Respiratory:  Positive for cough. Negative for shortness of breath.   Cardiovascular:  Negative for chest pain, leg swelling and palpitations.  Gastrointestinal:  Negative for abdominal pain, constipation, diarrhea, nausea and vomiting.  Genitourinary:  Negative for bladder  incontinence, difficulty urinating, dysuria, frequency, hematuria and nocturia.   Musculoskeletal:  Negative for arthralgias, back pain, flank pain, myalgias and neck pain.  Skin:  Negative for itching and rash.  Neurological:  Negative for dizziness, headaches and numbness.  Hematological:  Does not bruise/bleed easily.  Psychiatric/Behavioral:  Negative for depression, sleep disturbance and suicidal ideas. The patient is not nervous/anxious.   All other systems reviewed and are negative.    VITALS:   Blood pressure 127/80, pulse 74, temperature 98.1 F (36.7 C), temperature source Oral, resp. rate 18, height 6' (1.829 m), weight 237 lb 12.8 oz (107.9 kg), SpO2 98%.  Wt Readings from Last 3 Encounters:  05/12/23 237 lb 12.8 oz (107.9 kg)  04/27/23 230 lb (104.3 kg)  04/19/22 240 lb (108.9 kg)    Body mass index is 32.25 kg/m.  Performance status (ECOG): 0 - Asymptomatic  PHYSICAL EXAM:   Physical Exam Vitals and nursing note reviewed. Exam conducted with a chaperone present.  Constitutional:      Appearance: Normal appearance.  Cardiovascular:     Rate and Rhythm: Normal rate and regular rhythm.     Pulses: Normal pulses.     Heart sounds: Normal heart sounds.  Pulmonary:     Effort: Pulmonary effort is normal.     Breath sounds: Normal breath sounds.  Abdominal:     Palpations: Abdomen is soft. There is no  hepatomegaly, splenomegaly or mass.     Tenderness: There is no abdominal tenderness.  Musculoskeletal:     Right lower leg: No edema.     Left lower leg: No edema.  Lymphadenopathy:     Cervical: No cervical adenopathy.     Right cervical: No superficial, deep or posterior cervical adenopathy.    Left cervical: No superficial, deep or posterior cervical adenopathy.     Upper Body:     Right upper body: No supraclavicular or axillary adenopathy.     Left upper body: No supraclavicular or axillary adenopathy.  Neurological:     General: No focal deficit present.     Mental Status: He is alert and oriented to person, place, and time.  Psychiatric:        Mood and Affect: Mood normal.        Behavior: Behavior normal.     LABS:      Latest Ref Rng & Units 04/27/2023    9:41 PM 02/13/2018    5:34 AM 05/04/2009    1:23 AM  CBC  WBC 4.0 - 10.5 K/uL 10.4     Hemoglobin 13.0 - 17.0 g/dL 13.0  86.5  78.4   Hematocrit 39.0 - 52.0 % 47.5  44.0  48.0   Platelets 150 - 400 K/uL 296         Latest Ref Rng & Units 04/27/2023    9:41 PM 02/13/2018    5:34 AM 05/04/2009    1:23 AM  CMP  Glucose 70 - 99 mg/dL 97  696  295   BUN 6 - 20 mg/dL 13  19  16    Creatinine 0.61 - 1.24 mg/dL 2.84  1.32  0.7   Sodium 135 - 145 mmol/L 139  140  141   Potassium 3.5 - 5.1 mmol/L 4.2  4.0  3.6   Chloride 98 - 111 mmol/L 107  106  109   CO2 22 - 32 mmol/L 24     Calcium 8.9 - 10.3 mg/dL 9.1     Total Protein 6.5 - 8.1 g/dL  6.7     Total Bilirubin 0.3 - 1.2 mg/dL 0.4     Alkaline Phos 38 - 126 U/L 78     AST 15 - 41 U/L 25     ALT 0 - 44 U/L 27        No results found for: "CEA1", "CEA" / No results found for: "CEA1", "CEA" No results found for: "PSA1" No results found for: "CZY606" No results found for: "CAN125"  No results found for: "TOTALPROTELP", "ALBUMINELP", "A1GS", "A2GS", "BETS", "BETA2SER", "GAMS", "MSPIKE", "SPEI" No results found for: "TIBC", "FERRITIN", "IRONPCTSAT" No results  found for: "LDH"   STUDIES:   CT Chest W Contrast  Result Date: 04/28/2023 CLINICAL DATA:  Abnormal xray - lung nodule, >= 1 cm, nonproductive cough EXAM: CT CHEST WITH CONTRAST TECHNIQUE: Multidetector CT imaging of the chest was performed during intravenous contrast administration. RADIATION DOSE REDUCTION: This exam was performed according to the departmental dose-optimization program which includes automated exposure control, adjustment of the mA and/or kV according to patient size and/or use of iterative reconstruction technique. CONTRAST:  75mL OMNIPAQUE IOHEXOL 300 MG/ML  SOLN COMPARISON:  None Available. FINDINGS: Cardiovascular: Moderate multi-vessel coronary artery calcification. Global cardiac size within normal limits. No pericardial effusion. Central pulmonary arteries are of normal caliber. Mild atherosclerotic calcification within the thoracic aorta. 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 Ascending aorta and distal descending aorta are of normal caliber. Mediastinum/Nodes: Shotty mediastinal and right hilar adenopathy may be reactive in nature. Visualized thyroid is unremarkable. Esophagus is unremarkable. Lungs/Pleura: Mild emphysema. Extensive tree-in-bud nodularity is seen throughout the right upper lobe and superior segment of the right lower lobe in keeping with atypical infection, inhalational injury, or hypersensitivity pneumonitis though the latter 2 entities are considered less likely given the unilateral nature of the findings. Diffuse bronchial wall thickening is present in keeping with airway inflammation, more severe within the right upper lobe. There is platelike atelectasis within the basilar aspect of the right upper lobe. No definite superimposed focal pulmonary mass. No central obstructing lesion. No pneumothorax or pleural effusion. Upper Abdomen: A heterogeneously enhancing 3.5 cm mass is seen within the visualized upper  pole of the right kidney, incompletely included on this examination but suspicious for a primary renal neoplasm. Musculoskeletal: No acute bone abnormality. No lytic or blastic bone lesion. IMPRESSION: 1. Extensive tree-in-bud nodularity throughout the right upper lobe and superior segment of the right lower lobe in keeping with atypical infection, inhalational injury, or hypersensitivity pneumonitis though the latter 2 entities are considered less likely given the unilateral nature of the findings. 2. Moderate multi-vessel coronary artery calcification. 3. 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 4. Heterogeneously enhancing 3.5 cm mass within the visualized upper pole of the right kidney, incompletely included on this examination but suspicious for a primary renal neoplasm. Dedicated nonemergent renal mass protocol CT or MRI examination is recommended for further evaluation. Aortic Atherosclerosis (ICD10-I70.0) and Emphysema (ICD10-J43.9). Electronically Signed   By: Helyn Numbers M.D.   On: 04/28/2023 01:19   DG Chest 2 View  Result Date: 04/27/2023 CLINICAL DATA:  Cough, weak miss EXAM: CHEST - 2 VIEW COMPARISON:  None Available. FINDINGS: Platelike atelectasis within the right mid lung zone. This demonstrates more nodular contour within the superior segment of the right lower lobe and an underlying pulmonary nodule is difficult to exclude. No pneumothorax or pleural effusion. Cardiac size is within normal limits. Pulmonary vascularity is  normal. No acute bone abnormality. IMPRESSION: 1. Platelike atelectasis within the right mid lung zone. More nodular contour within the superior segment of the right lower lobe and an underlying pulmonary nodule is difficult to exclude. Follow-up chest radiograph is recommended in 3-4 weeks to document resolution. If persistent, contrast enhanced CT examination is recommended at that time to exclude an underlying mass  lesion. Electronically Signed   By: Helyn Numbers M.D.   On: 04/27/2023 22:35

## 2023-05-12 ENCOUNTER — Inpatient Hospital Stay: Payer: Self-pay

## 2023-05-12 ENCOUNTER — Inpatient Hospital Stay: Payer: Self-pay | Attending: Hematology | Admitting: Hematology

## 2023-05-12 VITALS — BP 127/80 | HR 74 | Temp 98.1°F | Resp 18 | Ht 72.0 in | Wt 237.8 lb

## 2023-05-12 DIAGNOSIS — F17210 Nicotine dependence, cigarettes, uncomplicated: Secondary | ICD-10-CM | POA: Insufficient documentation

## 2023-05-12 DIAGNOSIS — N2889 Other specified disorders of kidney and ureter: Secondary | ICD-10-CM | POA: Insufficient documentation

## 2023-05-12 NOTE — Patient Instructions (Addendum)
You were seen and examined today by Dr. Ellin Saba. Dr. Ellin Saba is a medical oncologist, meaning that he specializes in the treatment of cancer diagnoses. Dr. Ellin Saba discussed your past medical history, family history of cancers, and the events that led to you being here today.   He discussed with you getting an MRI of your abdomen and pelvis to investigate this further. You had this mass on a CT scan you had in 2019, which was smaller at that time. After the scan results, we will refer you to a surgeon to see if surgery is an option to treat this.   We will see you back after the MRI to discuss the results.   Return as scheduled.

## 2023-05-19 ENCOUNTER — Ambulatory Visit (HOSPITAL_COMMUNITY)
Admission: RE | Admit: 2023-05-19 | Discharge: 2023-05-19 | Disposition: A | Discharge status: Home / Self Care | Payer: Self-pay | Source: Ambulatory Visit | Attending: Hematology | Admitting: Hematology

## 2023-05-19 DIAGNOSIS — N2889 Other specified disorders of kidney and ureter: Secondary | ICD-10-CM | POA: Insufficient documentation

## 2023-05-19 MED ORDER — GADOBUTROL 1 MMOL/ML IV SOLN
10.0000 mL | Freq: Once | INTRAVENOUS | Status: AC | PRN
  Administered 2023-05-19: 10 mL via INTRAVENOUS

## 2023-05-26 ENCOUNTER — Encounter: Payer: Self-pay | Admitting: *Deleted

## 2023-05-26 ENCOUNTER — Inpatient Hospital Stay: Payer: Self-pay | Attending: Hematology | Admitting: Hematology

## 2023-05-26 VITALS — BP 126/80 | HR 68 | Temp 97.8°F | Resp 16 | Wt 238.4 lb

## 2023-05-26 DIAGNOSIS — N2889 Other specified disorders of kidney and ureter: Secondary | ICD-10-CM

## 2023-05-26 DIAGNOSIS — F17210 Nicotine dependence, cigarettes, uncomplicated: Secondary | ICD-10-CM | POA: Insufficient documentation

## 2023-05-26 NOTE — Progress Notes (Signed)
Call report from MRI abdomen/pelvis  IMPRESSION: 1. Heterogeneously enhancing mass arising from the medial superior pole of the right kidney measuring 3.8 x 3.4 cm, consistent with renal cell carcinoma. 2. No evidence of renal vein invasion, lymphadenopathy or metastatic disease in the abdomen or pelvis. 3. Sigmoid diverticulosis.  Impression sent to Dr. Doreatha Massed via secure chat.  No orders at this time.  Patient was seen in clinic today and these results were reviewed by provider and patient at that time.

## 2023-05-26 NOTE — Progress Notes (Signed)
[facility] [address] [place] [address]    Clinic Day:  05/26/2023  Referring physician: No ref. provider found  Patient Care Team: Patient, No Pcp Per as PCP - General (General Practice)   ASSESSMENT & PLAN:   Assessment: 1.  Right kidney mass: - Patient seen in the ER on 04/27/2023 with weakness and was diagnosed with pneumonia. - CT chest (04/27/2023): Incidental heterogeneously enhancing 3.5 cm mass within the visualized upper pole of the right kidney, incompletely included in the exam suspicious for primary renal neoplasm. - No hematuria/B symptoms. - MRI abdomen (05/19/2023): Heterogeneous enhancing mass arising from the medial superior pole of right kidney measuring 3.8 x 3.4 cm with no evidence of renal vein invasion.  No lymphadenopathy or metastatic disease.   2.  Social/family history: - Lives at home with his wife.  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  Has exposure to paints.  Current active smoker, 2 pack/day, started at age 26. - Mother died of rare type of cancer.  Type unknown to the patient.    Plan: 1.  Right kidney mass highly suspicious for RCC: - We reviewed MRI of the abdomen and pelvis from 05/19/2023 images.  This showed 3.8 x 3.4 cm right kidney mass arising from the medial superior pole of the right kidney with no adenopathy or renal vein invasion or metastatic disease. - I will make a referral to urology to discuss surgery/ablation options. - RTC 3 months for follow-up.    Orders Placed This Encounter  Procedures   CBC with Differential    Standing Status:   Future    Standing Expiration Date:   05/25/2024   Comprehensive metabolic panel    Standing Status:   Future    Standing Expiration Date:   05/25/2024      I,Katie Daubenspeck,acting as a scribe for Doreatha Massed, MD.,have documented all relevant documentation on the behalf of Doreatha Massed, MD,as directed by  Doreatha Massed, MD while in the  presence of Doreatha Massed, MD.   I, Doreatha Massed MD, have reviewed the above documentation for accuracy and completeness, and I agree with the above.   Doreatha Massed, MD   11/4/20242:53 PM  CHIEF COMPLAINT:   Diagnosis: Right kidney mass consistent with RCC   Cancer Staging  No matching staging information was found for the patient.    Prior Therapy: none  Current Therapy: Under workup   HISTORY OF PRESENT ILLNESS:   Oncology History   No history exists.     INTERVAL HISTORY:   Andre Donovan is a 57 y.o. male presenting to clinic today for follow up of right kidney mass. He was last seen by me on 05/12/23 in consultation.  Since his last visit, he underwent MRI A/P on 05/19/23 showing: 3.8 cm heterogeneously enhancing mass arising from medial superior pole of right kidney; no evidence of renal vein invasion, lymphadenopathy, or metastatic disease.  Today, he states that he is doing well overall. His appetite level is at 100%. His energy level is at 90%.  PAST MEDICAL HISTORY:   Past Medical History: No past medical history on file.  Surgical History: No past surgical history on file.  Social History: Social History   Socioeconomic History   Marital status: Married    Spouse name: Not on file   Number of children: Not on file   Years of education: Not on file   Highest education level: Not on file  Occupational History   Not  on file  Tobacco Use   Smoking status: Every Day    Current packs/day: 2.00    Types: Cigarettes   Smokeless tobacco: Never  Substance and Sexual Activity   Alcohol use: Yes    Comment: occasionally   Drug use: Yes    Types: Marijuana   Sexual activity: Not on file    Comment: rare  Other Topics Concern   Not on file  Social History Narrative   Not on file   Social Determinants of Health   Financial Resource Strain: Not on file  Food Insecurity: Not on file  Transportation Needs: Not on file  Physical  Activity: Not on file  Stress: Not on file  Social Connections: Not on file  Intimate Partner Violence: Not on file    Family History: No family history on file.  Current Medications:  Current Outpatient Medications:    HYDROcodone-acetaminophen (NORCO/VICODIN) 5-325 MG per tablet, Take 1 tablet by mouth every 4 (four) hours as needed., Disp: 15 tablet, Rfl: 0   naproxen (NAPROSYN) 500 MG tablet, Take 1 po BID with food prn pain, Disp: 20 tablet, Rfl: 0   ondansetron (ZOFRAN) 4 MG tablet, Take 1 tablet (4 mg total) by mouth every 8 (eight) hours as needed for nausea or vomiting., Disp: 10 tablet, Rfl: 0   oxyCODONE-acetaminophen (PERCOCET/ROXICET) 5-325 MG tablet, Take 1 tablet by mouth every 6 (six) hours as needed for severe pain., Disp: 12 tablet, Rfl: 0   predniSONE (DELTASONE) 10 MG tablet, Take 2 tablets (20 mg total) by mouth 2 (two) times daily., Disp: 20 tablet, Rfl: 0   tamsulosin (FLOMAX) 0.4 MG CAPS capsule, Take 1 po QD until you pass the stone., Disp: 10 capsule, Rfl: 0   valACYclovir (VALTREX) 1000 MG tablet, Take one 1,000 mg three times a day for 7 days, Disp: 21 tablet, Rfl: 0   Allergies: No Known Allergies  REVIEW OF SYSTEMS:   Review of Systems  Constitutional:  Negative for chills, fatigue and fever.  HENT:   Negative for lump/mass, mouth sores, nosebleeds, sore throat and trouble swallowing.   Eyes:  Negative for eye problems.  Respiratory:  Negative for cough and shortness of breath.   Cardiovascular:  Negative for chest pain, leg swelling and palpitations.  Gastrointestinal:  Negative for abdominal pain, constipation, diarrhea, nausea and vomiting.  Genitourinary:  Negative for bladder incontinence, difficulty urinating, dysuria, frequency, hematuria and nocturia.   Musculoskeletal:  Negative for arthralgias, back pain, flank pain, myalgias and neck pain.  Skin:  Negative for itching and rash.  Neurological:  Negative for dizziness, headaches and numbness.   Hematological:  Does not bruise/bleed easily.  Psychiatric/Behavioral:  Negative for depression, sleep disturbance and suicidal ideas. The patient is not nervous/anxious.   All other systems reviewed and are negative.    VITALS:   Blood pressure 126/80, pulse 68, temperature 97.8 F (36.6 C), temperature source Oral, resp. rate 16, weight 238 lb 6.4 oz (108.1 kg), SpO2 96%.  Wt Readings from Last 3 Encounters:  05/26/23 238 lb 6.4 oz (108.1 kg)  05/12/23 237 lb 12.8 oz (107.9 kg)  04/27/23 230 lb (104.3 kg)    Body mass index is 32.33 kg/m.  Performance status (ECOG): 1 - Symptomatic but completely ambulatory  PHYSICAL EXAM:   Physical Exam Vitals and nursing note reviewed. Exam conducted with a chaperone present.  Constitutional:      Appearance: Normal appearance.  Cardiovascular:     Rate and Rhythm: Normal rate and regular  rhythm.     Pulses: Normal pulses.     Heart sounds: Normal heart sounds.  Pulmonary:     Effort: Pulmonary effort is normal.     Breath sounds: Normal breath sounds.  Abdominal:     Palpations: Abdomen is soft. There is no hepatomegaly, splenomegaly or mass.     Tenderness: There is no abdominal tenderness.  Musculoskeletal:     Right lower leg: No edema.     Left lower leg: No edema.  Lymphadenopathy:     Cervical: No cervical adenopathy.     Right cervical: No superficial, deep or posterior cervical adenopathy.    Left cervical: No superficial, deep or posterior cervical adenopathy.     Upper Body:     Right upper body: No supraclavicular or axillary adenopathy.     Left upper body: No supraclavicular or axillary adenopathy.  Neurological:     General: No focal deficit present.     Mental Status: He is alert and oriented to person, place, and time.  Psychiatric:        Mood and Affect: Mood normal.        Behavior: Behavior normal.     LABS:      Latest Ref Rng & Units 04/27/2023    9:41 PM 02/13/2018    5:34 AM 05/04/2009    1:23  AM  CBC  WBC 4.0 - 10.5 K/uL 10.4     Hemoglobin 13.0 - 17.0 g/dL 09.8  11.9  14.7   Hematocrit 39.0 - 52.0 % 47.5  44.0  48.0   Platelets 150 - 400 K/uL 296         Latest Ref Rng & Units 04/27/2023    9:41 PM 02/13/2018    5:34 AM 05/04/2009    1:23 AM  CMP  Glucose 70 - 99 mg/dL 97  829  562   BUN 6 - 20 mg/dL 13  19  16    Creatinine 0.61 - 1.24 mg/dL 1.30  8.65  0.7   Sodium 135 - 145 mmol/L 139  140  141   Potassium 3.5 - 5.1 mmol/L 4.2  4.0  3.6   Chloride 98 - 111 mmol/L 107  106  109   CO2 22 - 32 mmol/L 24     Calcium 8.9 - 10.3 mg/dL 9.1     Total Protein 6.5 - 8.1 g/dL 6.7     Total Bilirubin 0.3 - 1.2 mg/dL 0.4     Alkaline Phos 38 - 126 U/L 78     AST 15 - 41 U/L 25     ALT 0 - 44 U/L 27        No results found for: "CEA1", "CEA" / No results found for: "CEA1", "CEA" No results found for: "PSA1" No results found for: "HQI696" No results found for: "CAN125"  No results found for: "TOTALPROTELP", "ALBUMINELP", "A1GS", "A2GS", "BETS", "BETA2SER", "GAMS", "MSPIKE", "SPEI" No results found for: "TIBC", "FERRITIN", "IRONPCTSAT" No results found for: "LDH"   STUDIES:   MR PELVIS W WO CONTRAST  Result Date: 05/24/2023 CLINICAL DATA:  Right renal mass incidentally identified by prior CT chest EXAM: MRI ABDOMEN AND PELVIS WITHOUT AND WITH CONTRAST TECHNIQUE: Multiplanar multisequence MR imaging of the abdomen and pelvis was performed both before and after the administration of intravenous contrast. CONTRAST:  10mL GADAVIST GADOBUTROL 1 MMOL/ML IV SOLN COMPARISON:  CT chest, 04/27/2023 FINDINGS: COMBINED FINDINGS FOR BOTH MR ABDOMEN AND PELVIS Lower chest: No acute abnormality. Hepatobiliary: No solid  liver abnormality is seen. Small definitively benign hemangioma of the right lobe of the liver, for which no further follow-up or characterization is required (series 7, image 7). No gallstones, gallbladder wall thickening, or biliary dilatation. Pancreas: Unremarkable. No  pancreatic ductal dilatation or surrounding inflammatory changes. Spleen: Normal in size without significant abnormality. Adrenals/Urinary Tract: Adrenal glands are unremarkable. Heterogeneously enhancing mass arising from the medial superior pole of the right kidney measuring 3.8 x 3.4 cm (series 11, image 48). Occasional small fluid signal renal cortical cysts, benign, for which no specific further follow-up or characterization is required. Left kidney is otherwise normal, without obvious renal calculi, solid lesion, or hydronephrosis. Bladder is unremarkable. Stomach/Bowel: Stomach is within normal limits. Appendix appears normal. No evidence of bowel wall thickening, distention, or inflammatory changes. Sigmoid diverticulosis. Vascular/Lymphatic: Aortic atherosclerosis. No enlarged abdominal or pelvic lymph nodes. Reproductive: No mass or other significant abnormality. Other: No abdominal wall hernia or abnormality. No ascites. Musculoskeletal: No acute or significant osseous findings. IMPRESSION: 1. Heterogeneously enhancing mass arising from the medial superior pole of the right kidney measuring 3.8 x 3.4 cm, consistent with renal cell carcinoma. 2. No evidence of renal vein invasion, lymphadenopathy or metastatic disease in the abdomen or pelvis. 3. Sigmoid diverticulosis. These results will be called to the ordering clinician or representative by the Radiologist Assistant, and communication documented in the PACS or [employer]. Aortic Atherosclerosis (ICD10-I70.0). Electronically Signed   By: Jearld Lesch M.D.   On: 05/24/2023 16:03   MR ABDOMEN WWO CONTRAST  Result Date: 05/24/2023 CLINICAL DATA:  Right renal mass incidentally identified by prior CT chest EXAM: MRI ABDOMEN AND PELVIS WITHOUT AND WITH CONTRAST TECHNIQUE: Multiplanar multisequence MR imaging of the abdomen and pelvis was performed both before and after the administration of intravenous contrast. CONTRAST:  10mL GADAVIST GADOBUTROL  1 MMOL/ML IV SOLN COMPARISON:  CT chest, 04/27/2023 FINDINGS: COMBINED FINDINGS FOR BOTH MR ABDOMEN AND PELVIS Lower chest: No acute abnormality. Hepatobiliary: No solid liver abnormality is seen. Small definitively benign hemangioma of the right lobe of the liver, for which no further follow-up or characterization is required (series 7, image 7). No gallstones, gallbladder wall thickening, or biliary dilatation. Pancreas: Unremarkable. No pancreatic ductal dilatation or surrounding inflammatory changes. Spleen: Normal in size without significant abnormality. Adrenals/Urinary Tract: Adrenal glands are unremarkable. Heterogeneously enhancing mass arising from the medial superior pole of the right kidney measuring 3.8 x 3.4 cm (series 11, image 48). Occasional small fluid signal renal cortical cysts, benign, for which no specific further follow-up or characterization is required. Left kidney is otherwise normal, without obvious renal calculi, solid lesion, or hydronephrosis. Bladder is unremarkable. Stomach/Bowel: Stomach is within normal limits. Appendix appears normal. No evidence of bowel wall thickening, distention, or inflammatory changes. Sigmoid diverticulosis. Vascular/Lymphatic: Aortic atherosclerosis. No enlarged abdominal or pelvic lymph nodes. Reproductive: No mass or other significant abnormality. Other: No abdominal wall hernia or abnormality. No ascites. Musculoskeletal: No acute or significant osseous findings. IMPRESSION: 1. Heterogeneously enhancing mass arising from the medial superior pole of the right kidney measuring 3.8 x 3.4 cm, consistent with renal cell carcinoma. 2. No evidence of renal vein invasion, lymphadenopathy or metastatic disease in the abdomen or pelvis. 3. Sigmoid diverticulosis. These results will be called to the ordering clinician or representative by the Radiologist Assistant, and communication documented in the PACS or [employer]. Aortic Atherosclerosis (ICD10-I70.0).  Electronically Signed   By: Jearld Lesch M.D.   On: 05/24/2023 16:03   CT Chest W Contrast  Result Date: 04/28/2023 CLINICAL DATA:  Abnormal xray - lung nodule, >= 1 cm, nonproductive cough EXAM: CT CHEST WITH CONTRAST TECHNIQUE: Multidetector CT imaging of the chest was performed during intravenous contrast administration. RADIATION DOSE REDUCTION: This exam was performed according to the departmental dose-optimization program which includes automated exposure control, adjustment of the mA and/or kV according to patient size and/or use of iterative reconstruction technique. CONTRAST:  75mL OMNIPAQUE IOHEXOL 300 MG/ML  SOLN COMPARISON:  None Available. FINDINGS: Cardiovascular: Moderate multi-vessel coronary artery calcification. Global cardiac size within normal limits. No pericardial effusion. Central pulmonary arteries are of normal caliber. Mild atherosclerotic calcification within the thoracic aorta. 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 Ascending aorta and distal descending aorta are of normal caliber. Mediastinum/Nodes: Shotty mediastinal and right hilar adenopathy may be reactive in nature. Visualized thyroid is unremarkable. Esophagus is unremarkable. Lungs/Pleura: Mild emphysema. Extensive tree-in-bud nodularity is seen throughout the right upper lobe and superior segment of the right lower lobe in keeping with atypical infection, inhalational injury, or hypersensitivity pneumonitis though the latter 2 entities are considered less likely given the unilateral nature of the findings. Diffuse bronchial wall thickening is present in keeping with airway inflammation, more severe within the right upper lobe. There is platelike atelectasis within the basilar aspect of the right upper lobe. No definite superimposed focal pulmonary mass. No central obstructing lesion. No pneumothorax or pleural effusion. Upper Abdomen: A heterogeneously enhancing 3.5 cm mass  is seen within the visualized upper pole of the right kidney, incompletely included on this examination but suspicious for a primary renal neoplasm. Musculoskeletal: No acute bone abnormality. No lytic or blastic bone lesion. IMPRESSION: 1. Extensive tree-in-bud nodularity throughout the right upper lobe and superior segment of the right lower lobe in keeping with atypical infection, inhalational injury, or hypersensitivity pneumonitis though the latter 2 entities are considered less likely given the unilateral nature of the findings. 2. Moderate multi-vessel coronary artery calcification. 3. 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 4. Heterogeneously enhancing 3.5 cm mass within the visualized upper pole of the right kidney, incompletely included on this examination but suspicious for a primary renal neoplasm. Dedicated nonemergent renal mass protocol CT or MRI examination is recommended for further evaluation. Aortic Atherosclerosis (ICD10-I70.0) and Emphysema (ICD10-J43.9). Electronically Signed   By: Helyn Numbers M.D.   On: 04/28/2023 01:19   DG Chest 2 View  Result Date: 04/27/2023 CLINICAL DATA:  Cough, weak miss EXAM: CHEST - 2 VIEW COMPARISON:  None Available. FINDINGS: Platelike atelectasis within the right mid lung zone. This demonstrates more nodular contour within the superior segment of the right lower lobe and an underlying pulmonary nodule is difficult to exclude. No pneumothorax or pleural effusion. Cardiac size is within normal limits. Pulmonary vascularity is normal. No acute bone abnormality. IMPRESSION: 1. Platelike atelectasis within the right mid lung zone. More nodular contour within the superior segment of the right lower lobe and an underlying pulmonary nodule is difficult to exclude. Follow-up chest radiograph is recommended in 3-4 weeks to document resolution. If persistent, contrast enhanced CT examination is recommended at that time  to exclude an underlying mass lesion. Electronically Signed   By: Helyn Numbers M.D.   On: 04/27/2023 22:35

## 2023-05-26 NOTE — Patient Instructions (Addendum)
[facility] at [facility] Discharge Instructions   You were seen and examined today by Dr. Ellin Saba.  He reviewed the results of your MRI. It is showing a mass on the kidney that is highly suspicious for cancer. There is no spread of the cancer to the lymph nodes or any other areas in the abdomen. This is a Stage I cancer. We will refer you to a surgeon to have this cancer cut out.   We will see you back 4 weeks after surgery.   Return as scheduled.    Thank you for choosing [facility] at [facility] to provide your oncology and hematology care.  To afford each patient quality time with our provider, please arrive at least 15 minutes before your scheduled appointment time.   If you have a lab appointment with the Cancer Center please come in thru the Main Entrance and check in at the main information desk.  You need to re-schedule your appointment should you arrive 10 or more minutes late.  We strive to give you quality time with our providers, and arriving late affects you and other patients whose appointments are after yours.  Also, if you no show three or more times for appointments you may be dismissed from the clinic at the providers discretion.     Again, thank you for choosing [facility].  Our hope is that these requests will decrease the amount of time that you wait before being seen by our physicians.       _____________________________________________________________  Should you have questions after your visit to [facility], please contact our office at [phone] and follow the prompts.  Our office hours are 8:00 a.m. and 4:30 p.m. Monday - Friday.  Please note that voicemails left after 4:00 p.m. may not be returned until the following business day.  We are closed weekends and major holidays.  You do have access to a nurse 24-7, just call the main number to the clinic [phone] and do not press any  options, hold on the line and a nurse will answer the phone.    For prescription refill requests, have your pharmacy contact our office and allow 72 hours.    Due to Covid, you will need to wear a mask upon entering the hospital. If you do not have a mask, a mask will be given to you at the Main Entrance upon arrival. For doctor visits, patients may have 1 support person age 60 or older with them. For treatment visits, patients can not have anyone with them due to social distancing guidelines and our immunocompromised population.

## 2023-06-02 ENCOUNTER — Encounter: Payer: Self-pay | Admitting: Urology

## 2023-06-02 ENCOUNTER — Ambulatory Visit (INDEPENDENT_AMBULATORY_CARE_PROVIDER_SITE_OTHER): Payer: Self-pay | Admitting: Urology

## 2023-06-02 VITALS — BP 119/79 | HR 87 | Ht 72.0 in | Wt 238.4 lb

## 2023-06-02 DIAGNOSIS — N2889 Other specified disorders of kidney and ureter: Secondary | ICD-10-CM

## 2023-06-02 NOTE — Patient Instructions (Signed)

## 2023-06-02 NOTE — Progress Notes (Signed)
06/02/2023 9:47 AM   Andre Donovan 1966/01/06 [id number]  Referring provider: Doreatha Massed, MD [address] [place],  [place] [address]  Renal mass   HPI: Mr Andre Donovan is a 57yo here for evaluation of a right renal mass. He underwent CT chest in 10/6 which showed a right renal mass. He then underwent MRI which showed a right upper pole posterior renal mass concerning for malignancy.    PMH: No past medical history on file.  Surgical History: No past surgical history on file.  Home Medications:  Allergies as of 06/02/2023   No Known Allergies      Medication List        Accurate as of June 02, 2023  9:47 AM. If you have any questions, ask your nurse or doctor.          HYDROcodone-acetaminophen 5-325 MG tablet Commonly known as: NORCO/VICODIN Take 1 tablet by mouth every 4 (four) hours as needed.   naproxen 500 MG tablet Commonly known as: NAPROSYN Take 1 po BID with food prn pain   ondansetron 4 MG tablet Commonly known as: ZOFRAN Take 1 tablet (4 mg total) by mouth every 8 (eight) hours as needed for nausea or vomiting.   oxyCODONE-acetaminophen 5-325 MG tablet Commonly known as: PERCOCET/ROXICET Take 1 tablet by mouth every 6 (six) hours as needed for severe pain.   predniSONE 10 MG tablet Commonly known as: DELTASONE Take 2 tablets (20 mg total) by mouth 2 (two) times daily.   tamsulosin 0.4 MG Caps capsule Commonly known as: Flomax Take 1 po QD until you pass the stone.   valACYclovir 1000 MG tablet Commonly known as: VALTREX Take one 1,000 mg three times a day for 7 days        Allergies: No Known Allergies  Family History: No family history on file.  Social History:  reports that he has been smoking. He has never used smokeless tobacco. He reports current alcohol use. He reports current drug use. Drug: Marijuana.  ROS: All other review of systems were reviewed and are negative except what is noted above in  HPI  Physical Exam: BP 119/79   Pulse 87   Ht 6' (1.829 m)   Wt 238 lb 6.4 oz (108.1 kg)   BMI 32.33 kg/m   Constitutional:  Alert and oriented, No acute distress. HEENT: [place] AT, moist mucus membranes.  Trachea midline, no masses. Cardiovascular: No clubbing, cyanosis, or edema. Respiratory: Normal respiratory effort, no increased work of breathing. GI: Abdomen is soft, nontender, nondistended, no abdominal masses GU: No CVA tenderness.  Lymph: No cervical or inguinal lymphadenopathy. Skin: No rashes, bruises or suspicious lesions. Neurologic: Grossly intact, no focal deficits, moving all 4 extremities. Psychiatric: Normal mood and affect.  Laboratory Data: Lab Results  Component Value Date   WBC 10.4 04/27/2023   HGB 16.1 04/27/2023   HCT 47.5 04/27/2023   MCV 92.8 04/27/2023   PLT 296 04/27/2023    Lab Results  Component Value Date   CREATININE 0.80 04/27/2023    No results found for: "PSA"  No results found for: "TESTOSTERONE"  No results found for: "HGBA1C"  Urinalysis    Component Value Date/Time   COLORURINE YELLOW 04/27/2023 2107   APPEARANCEUR CLEAR 04/27/2023 2107   LABSPEC 1.018 04/27/2023 2107   PHURINE 6.0 04/27/2023 2107   GLUCOSEU NEGATIVE 04/27/2023 2107   HGBUR NEGATIVE 04/27/2023 2107   BILIRUBINUR NEGATIVE 04/27/2023 2107   KETONESUR NEGATIVE 04/27/2023 2107   PROTEINUR NEGATIVE  04/27/2023 2107   UROBILINOGEN 1.0 05/04/2009 0125   NITRITE NEGATIVE 04/27/2023 2107   LEUKOCYTESUR NEGATIVE 04/27/2023 2107    No results found for: "LABMICR", "WBCUA", "RBCUA", "LABEPIT", "MUCUS", "BACTERIA"  Pertinent Imaging: MRI 05/19/2023: Images reviewed and discussed with the patient  No results found for this or any previous visit.  No results found for this or any previous visit.  No results found for this or any previous visit.  No results found for this or any previous visit.  No results found for this or any previous visit.  No valid  procedures specified. No results found for this or any previous visit.  Results for orders placed during the hospital encounter of 02/13/18  CT Renal Stone Study  Narrative CLINICAL DATA:  Right flank pain since 3 a.m. today.  No hematuria.  EXAM: CT ABDOMEN AND PELVIS WITHOUT CONTRAST  TECHNIQUE: Multidetector CT imaging of the abdomen and pelvis was performed following the standard protocol without IV contrast.  COMPARISON:  None.  FINDINGS: Lower chest: Lung bases are clear.  Hepatobiliary: No focal liver abnormality is seen. No gallstones, gallbladder wall thickening, or biliary dilatation.  Pancreas: Unremarkable. No pancreatic ductal dilatation or surrounding inflammatory changes.  Spleen: Normal in size without focal abnormality.  Adrenals/Urinary Tract: No adrenal gland nodules. 3 mm stone in the distal right ureter just above the ureterovesical junction. No significant hydronephrosis or hydroureter. No other stones identified. Bladder is decompressed. Small nodule on the upper pole of the right kidney measuring about 1.2 cm diameter appears solid.  Stomach/Bowel: Stomach, small bowel, and colon are not abnormally distended. Scattered diverticula in the colon without evidence of diverticulitis. No wall thickening or inflammatory changes appreciated. Appendix is normal.  Vascular/Lymphatic: Aortic atherosclerosis. No enlarged abdominal or pelvic lymph nodes.  Reproductive: Prostate is unremarkable.  Other: No abdominal wall hernia or abnormality. No abdominopelvic ascites.  Musculoskeletal: Mild degenerative changes in the spine. No destructive bone lesions.  IMPRESSION: 1. 3 mm stone in the distal right ureter without significant proximal obstruction. 2. 1.2 cm diameter solid appearing nodule in the upper pole right kidney. Further characterization with renal protocol CT or MRI is suggested. 3. Mild aortic atherosclerosis.   Electronically  Signed By: Burman Nieves M.D. On: 02/13/2018 06:08   Assessment & Plan:    1. Renal mass We discussed the natural hx of renal masses and the 80/20 malignant/benign likelihood. We disucssed the treatment options including active surveillance. Renal ablation, partial and radical nephrectomy. I will refer the patient for possible renal mass ablation. If his mass is not amenable to ablation then we will likely proceed with right robotic partial nephrectomy   No follow-ups on file.  Wilkie Aye, MD  [facility] [place]

## 2023-06-02 NOTE — Addendum Note (Signed)
Addended by: Gustavus Messing on: 06/02/2023 10:01 AM   Modules accepted: Orders

## 2023-06-11 ENCOUNTER — Ambulatory Visit
Admission: RE | Admit: 2023-06-11 | Discharge: 2023-06-11 | Disposition: A | Discharge status: Home / Self Care | Payer: Self-pay | Source: Ambulatory Visit | Attending: Urology | Admitting: Urology

## 2023-06-11 DIAGNOSIS — N2889 Other specified disorders of kidney and ureter: Secondary | ICD-10-CM

## 2023-06-11 HISTORY — PX: IR RADIOLOGIST EVAL & MGMT: IMG5224

## 2023-06-11 NOTE — Progress Notes (Signed)
Reason for visit: R renal mass evaluation, referred by [facility] L MD  Care Team(s) PCP: Patient, No Pcp Per  Urology: Malen Gauze, MD Medical Oncology: Doreatha Massed, MD  History of Present Illness:  Andre Donovan is a 57 y.o. 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 Pt is accompanied by his spouse, Andre Donovan, who report that Pt presented to [facility] ER on 04/27/23 w cough and had CXR which demonstrated R lung findings. Additional imaging w CT chest demonstrated an incidental R renal mass, and Andre abdomen follow up (05/19/23) revealed an enhancing 3.8 cm mass suspicious for RCC.   He was seen by medical oncology, Dr Ellin Saba, and referred to urology, Dr Ronne Binning, who recommended evaluation for minimally invasive treatment. On chart review, Pt had a 1.2 cm mass at R kidney on CT AP renal stone protocol (02/13/18). He denies any discomfort at this time, unintentional weight loss, hematuria or localizing symptoms.   Review of Systems: A 12-point ROS discussed, and pertinent positives are indicated in the HPI above.  All other systems are negative.   No past medical history on file.  Past Surgical History:  Procedure Laterality Date   IR RADIOLOGIST EVAL & MGMT  06/11/2023    Allergies: Patient has no known allergies.  Medications: Prior to Admission medications   Medication Sig Start Date End Date Taking? Authorizing Provider  HYDROcodone-acetaminophen (NORCO/VICODIN) 5-325 MG per tablet Take 1 tablet by mouth every 4 (four) hours as needed. 04/03/13   Janne Napoleon, NP  naproxen (NAPROSYN) 500 MG tablet Take 1 po BID with food prn pain 02/13/18   Devoria Albe, MD  ondansetron (ZOFRAN) 4 MG tablet Take 1 tablet (4 mg total) by mouth every 8 (eight) hours as needed for nausea or vomiting. 02/13/18   Devoria Albe, MD  oxyCODONE-acetaminophen (PERCOCET/ROXICET) 5-325 MG tablet Take 1 tablet by mouth  every 6 (six) hours as needed for severe pain. 02/13/18   Devoria Albe, MD  predniSONE (DELTASONE) 10 MG tablet Take 2 tablets (20 mg total) by mouth 2 (two) times daily. 04/19/22   Geoffery Lyons, MD  tamsulosin (FLOMAX) 0.4 MG CAPS capsule Take 1 po QD until you pass the stone. 02/13/18   Devoria Albe, MD  valACYclovir (VALTREX) 1000 MG tablet Take one 1,000 mg three times a day for 7 days 04/03/13   Janne Napoleon, NP     No family history on file.  Social History   Socioeconomic History   Marital status: Married    Spouse name: Not on file   Number of children: Not on file   Years of education: Not on file   Highest education level: Not on file  Occupational History   Not on file  Tobacco Use   Smoking status: Every Day    Current packs/day: 2.00    Types: Cigarettes   Smokeless tobacco: Never  Substance and Sexual Activity   Alcohol use: Yes    Comment: occasionally   Drug use: Yes    Types: Marijuana   Sexual activity: Not on file    Comment: rare  Other Topics Concern   Not on file  Social History Narrative   Not on file   Social Determinants of Health   Financial Resource Strain: Not on file  Food Insecurity: Not on file  Transportation Needs: Not on file  Physical Activity: Not on file  Stress: Not on file  Social Connections: Not on file    ECOG Status: 0 - Asymptomatic   Review of Systems As above  Vital Signs: BP 121/79   Pulse 76   Temp 97.6 F (36.4 C)   Resp 20   SpO2 96%   Physical Exam  General: WN, NAD  CV: RRR on monitor Pulm: normal work of breathing on RA Abd: S, ND, NT MSK: Grossly normal Psych: Appropriate affect.  Imaging:  Andre Abdomen. 10/30/21 Independently reviewed, showing R posterior superior pole renal mass. No tumor thrombus. Mass is remeasured up to 3.8 * 3.4 * 3.2 cm in greatest dimension.    IMPRESSION:  1. Heterogeneously enhancing mass arising from the medial superior pole of the right kidney measuring 3.8 x 3.4 cm,  consistent with renal cell carcinoma.  2. No evidence of renal vein invasion, lymphadenopathy or metastatic disease in the abdomen or pelvis.   Labs:  CBC: Recent Labs    04/27/23 2141  WBC 10.4  HGB 16.1  HCT 47.5  PLT 296    COAGS: No results for input(s): "INR", "APTT" in the last 8760 hours.  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 Andre abdomen (05/19/23) w an enhancing 3.8 cm mass suspicious for RCC Prior CT AP renal stone protocol (02/13/18) w 1.2 cm mass at R kidney  He is interested in pursuing a minimally-invasive option for the treatment of his renal mass at this time, and is curious about percutaneous ablation   *multiphase Andre Abdomen (05/19/23) reviewed.  *No reported cardiac history. Cardiac clearance not required.  *Mild emphysematous lung change on CT chest, on RA. Will defer on pulmonary clearance. *Pre anesthesia evaluation.  *OK to proceed to schedule for RIGHT RENAL MASS CRYOABLATION with BIOPSY.  *procedure to be performed at [facility] with GETA. Tentatively on 08/06/23. *Anticipate overnight admission post procedure *new Labs (CBC, CMP, Coags) on the day of procedure   Thank you for this interesting consult.  I greatly enjoyed meeting Andre Donovan and look forward to participating in their care.  A copy of this report was sent to the requesting provider on this date.  Electronically Signed:  Roanna Banning, MD [facility]   Pager. [phone] Clinic. [phone]  I spent a total of 60 Minutes in face to face in clinical consultation, greater  than 50% of which was counseling/coordinating care for Andre Donovan' renal mass evaluation and treatment

## 2023-06-23 ENCOUNTER — Other Ambulatory Visit (HOSPITAL_COMMUNITY): Payer: Self-pay | Admitting: Interventional Radiology

## 2023-06-23 DIAGNOSIS — N2889 Other specified disorders of kidney and ureter: Secondary | ICD-10-CM

## 2023-07-18 ENCOUNTER — Other Ambulatory Visit: Payer: Self-pay | Admitting: Radiology

## 2023-07-18 DIAGNOSIS — N2889 Other specified disorders of kidney and ureter: Secondary | ICD-10-CM

## 2023-07-18 NOTE — Progress Notes (Signed)
Orders requested with Caryn Bee in radiology.

## 2023-07-22 NOTE — Patient Instructions (Addendum)
 SURGICAL WAITING ROOM VISITATION  Patients having surgery or a procedure may have no more than 2 support people in the waiting area - these visitors may rotate.    Children under the age of 82 must have an adult with them who is not the patient.  Due to an increase in RSV and influenza rates and associated hospitalizations, children ages 44 and under may not visit patients in [facility] hospitals.  If the patient needs to stay at the hospital during part of their recovery, the visitor guidelines for inpatient rooms apply. Pre-op nurse will coordinate an appropriate time for 1 support person to accompany patient in pre-op.  This support person may not rotate.    Please refer to the [facility] website for the visitor guidelines for Inpatients (after your surgery is over and you are in a regular room).       Your procedure is scheduled on:  08/06/2023    Report to [facility] Main Entrance    Report to admitting at   0630 am    Call this number if you have problems the morning of surgery [phone]   Do not eat food or drink liquids  :After Midnight.                If you have questions, please contact your surgeon's office.        Oral Hygiene is also important to reduce your risk of infection.                                    Remember - BRUSH YOUR TEETH THE MORNING OF SURGERY WITH YOUR REGULAR TOOTHPASTE  DENTURES WILL BE REMOVED PRIOR TO SURGERY PLEASE DO NOT APPLY Poly grip OR ADHESIVES!!!   Do NOT smoke after Midnight   Stop all vitamins and herbal supplements 7 days before surgery.   Take these medicines the morning of surgery with A SIP OF WATER:  none   DO NOT TAKE ANY ORAL DIABETIC MEDICATIONS DAY OF YOUR SURGERY  Bring CPAP mask and tubing day of surgery.                              You may not have any metal on your body including hair pins, jewelry, and body piercing             Do not wear make-up, lotions, powders, perfumes/cologne, or  deodorant  Do not wear nail polish including gel and S&S, artificial/acrylic nails, or any other type of covering on natural nails including finger and toenails. If you have artificial nails, gel coating, etc. that needs to be removed by a nail salon please have this removed prior to surgery or surgery may need to be canceled/ delayed if the surgeon/ anesthesia feels like they are unable to be safely monitored.   Do not shave  48 hours prior to surgery.               Men may shave face and neck.   Do not bring valuables to the hospital. [facility] IS NOT             RESPONSIBLE   FOR VALUABLES.   Contacts, glasses, dentures or bridgework may not be worn into surgery.   Bring small overnight bag day of surgery.   DO NOT BRING YOUR HOME MEDICATIONS TO THE  HOSPITAL. PHARMACY WILL DISPENSE MEDICATIONS LISTED ON YOUR MEDICATION LIST TO YOU DURING YOUR ADMISSION IN THE HOSPITAL!    Patients discharged on the day of surgery will not be allowed to drive home.  Someone NEEDS to stay with you for the first 24 hours after anesthesia.   Special Instructions: Bring a copy of your healthcare power of attorney and living will documents the day of surgery if you haven't scanned them before.              Please read over the following fact sheets you were given: IF YOU HAVE QUESTIONS ABOUT YOUR PRE-OP INSTRUCTIONS PLEASE CALL [phone]   If you received a COVID test during your pre-op visit  it is requested that you wear a mask when out in public, stay away from anyone that may not be feeling well and notify your surgeon if you develop symptoms. If you test positive for Covid or have been in contact with anyone that has tested positive in the last 10 days please notify you surgeon.    [facility] - Preparing for Surgery Before surgery, you can play an important role.  Because skin is not sterile, your skin needs to be as free of germs as possible.  You can reduce the number of germs on your skin by  washing with CHG (chlorahexidine gluconate) soap before surgery.  CHG is an antiseptic cleaner which kills germs and bonds with the skin to continue killing germs even after washing. Please DO NOT use if you have an allergy to CHG or antibacterial soaps.  If your skin becomes reddened/irritated stop using the CHG and inform your nurse when you arrive at Short Stay. Do not shave (including legs and underarms) for at least 48 hours prior to the first CHG shower.  You may shave your face/neck. Please follow these instructions carefully:  1.  Shower with CHG Soap the night before surgery and the  morning of Surgery.  2.  If you choose to wash your hair, wash your hair first as usual with your  normal  shampoo.  3.  After you shampoo, rinse your hair and body thoroughly to remove the  shampoo.                           4.  Use CHG as you would any other liquid soap.  You can apply chg directly  to the skin and wash                       Gently with a scrungie or clean washcloth.  5.  Apply the CHG Soap to your body ONLY FROM THE NECK DOWN.   Do not use on face/ open                           Wound or open sores. Avoid contact with eyes, ears mouth and genitals (private parts).                       Wash face,  Genitals (private parts) with your normal soap.             6.  Wash thoroughly, paying special attention to the area where your surgery  will be performed.  7.  Thoroughly rinse your body with warm water from the neck down.  8.  DO NOT shower/wash with your normal soap  after using and rinsing off  the CHG Soap.                9.  Pat yourself dry with a clean towel.            10.  Wear clean pajamas.            11.  Place clean sheets on your bed the night of your first shower and do not  sleep with pets. Day of Surgery : Do not apply any lotions/deodorants the morning of surgery.  Please wear clean clothes to the hospital/surgery center.  FAILURE TO FOLLOW THESE INSTRUCTIONS MAY RESULT IN THE  CANCELLATION OF YOUR SURGERY PATIENT SIGNATURE_________________________________  NURSE SIGNATURE__________________________________  ________________________________________________________________________

## 2023-07-22 NOTE — Progress Notes (Addendum)
 Anesthesia Review:  PCP: none  Cardiologist : none  Chest x-ray :2v-04/27/2023  also 07/28/23  EKG  04/27/2023  Echo : Stress test: Cardiac Cath :  Activity level: can do a flight of stairs without difficulty  Sleep Study/ CPAP : none  Fasting Blood Sugar :      / Checks Blood Sugar -- times a day:   Blood Thinner/ Instructions /Last Dose: ASA / Instructions/ Last Dose    IN ED on 04/27/2023 for weakness and community acquired pneumonia  No followup since per pt on 07/28/23.  PT voices no complaints.  CXR ordered by Radiology MD Done on 07/28/23.    CBC done 07/28/23 routed to DR mugweru on 07/28/23 - hgb 17.4

## 2023-07-25 ENCOUNTER — Ambulatory Visit: Payer: Self-pay | Admitting: Urology

## 2023-07-25 DIAGNOSIS — N2889 Other specified disorders of kidney and ureter: Secondary | ICD-10-CM

## 2023-07-28 ENCOUNTER — Encounter (HOSPITAL_COMMUNITY): Payer: Self-pay

## 2023-07-28 ENCOUNTER — Encounter (HOSPITAL_COMMUNITY)
Admission: RE | Admit: 2023-07-28 | Discharge: 2023-07-28 | Disposition: A | Discharge status: Home / Self Care | Payer: Self-pay | Source: Ambulatory Visit | Attending: Interventional Radiology | Admitting: Interventional Radiology

## 2023-07-28 ENCOUNTER — Other Ambulatory Visit: Payer: Self-pay

## 2023-07-28 ENCOUNTER — Ambulatory Visit (HOSPITAL_COMMUNITY)
Admission: RE | Admit: 2023-07-28 | Discharge: 2023-07-28 | Disposition: A | Discharge status: Home / Self Care | Payer: Self-pay | Source: Ambulatory Visit | Attending: Radiology | Admitting: Radiology

## 2023-07-28 DIAGNOSIS — Z01818 Encounter for other preprocedural examination: Secondary | ICD-10-CM | POA: Insufficient documentation

## 2023-07-28 DIAGNOSIS — N2889 Other specified disorders of kidney and ureter: Secondary | ICD-10-CM | POA: Insufficient documentation

## 2023-07-28 HISTORY — DX: Personal history of urinary calculi: Z87.442

## 2023-07-28 HISTORY — DX: Malignant (primary) neoplasm, unspecified: C80.1

## 2023-07-28 HISTORY — DX: Pneumonia, unspecified organism: J18.9

## 2023-07-28 LAB — PROTIME-INR
INR: 1 (ref 0.8–1.2)
Prothrombin Time: 13.6 s (ref 11.4–15.2)

## 2023-07-28 LAB — CBC WITH DIFFERENTIAL/PLATELET
Abs Immature Granulocytes: 0.03 10*3/uL (ref 0.00–0.07)
Basophils Absolute: 0.1 10*3/uL (ref 0.0–0.1)
Basophils Relative: 1 %
Eosinophils Absolute: 0.1 10*3/uL (ref 0.0–0.5)
Eosinophils Relative: 2 %
HCT: 51.4 % (ref 39.0–52.0)
Hemoglobin: 17.4 g/dL — ABNORMAL HIGH (ref 13.0–17.0)
Immature Granulocytes: 0 %
Lymphocytes Relative: 28 %
Lymphs Abs: 2.3 10*3/uL (ref 0.7–4.0)
MCH: 32 pg (ref 26.0–34.0)
MCHC: 33.9 g/dL (ref 30.0–36.0)
MCV: 94.5 fL (ref 80.0–100.0)
Monocytes Absolute: 0.5 10*3/uL (ref 0.1–1.0)
Monocytes Relative: 6 %
Neutro Abs: 5 10*3/uL (ref 1.7–7.7)
Neutrophils Relative %: 63 %
Platelets: 174 10*3/uL (ref 150–400)
RBC: 5.44 MIL/uL (ref 4.22–5.81)
RDW: 13 % (ref 11.5–15.5)
WBC: 8 10*3/uL (ref 4.0–10.5)
nRBC: 0 % (ref 0.0–0.2)

## 2023-07-28 LAB — BASIC METABOLIC PANEL
Anion gap: 7 (ref 5–15)
BUN: 12 mg/dL (ref 6–20)
CO2: 23 mmol/L (ref 22–32)
Calcium: 9.5 mg/dL (ref 8.9–10.3)
Chloride: 107 mmol/L (ref 98–111)
Creatinine, Ser: 0.8 mg/dL (ref 0.61–1.24)
GFR, Estimated: >60 mL/min (ref >60)
Glucose, Bld: 100 mg/dL — ABNORMAL HIGH (ref 70–99)
Potassium: 4.7 mmol/L (ref 3.5–5.1)
Sodium: 137 mmol/L (ref 135–145)

## 2023-08-04 ENCOUNTER — Other Ambulatory Visit: Payer: Self-pay | Admitting: Radiology

## 2023-08-04 NOTE — H&P (Addendum)
Chief Complaint: Patient was seen in consultation today for image guided right renal mass cryoablation/possible biopsy ; concern for renal cell cancer   Referring Physician(s): McKenzie,P  Supervising Physician: Roanna Banning  Patient Status: [facility] - Out-pt  History of Present Illness: Andre Donovan is a 58 y.o. male with history of tobacco abuse, and renal mass. Patient was referred to Dr. Milford Cage by Malen Gauze MD for a right renal mass. Patient initially presented to the ER at [facility] 04/27/23 with a cough. Chest CT reported a heterogeneously enhancing 3.5 cm mass within the visualized upper pole of the right kidney. This lesion was suspicious for a primary renal neoplasm. Follow up MRI abdomen/pelvis 10/28 reported the same heterogeneously enhancing mass in the medial superior post of the right kidney measuring 3.8 x 3.4 cm. The finding on MRI was consistent with renal cell carcinoma. Patient followed up with Dr. Milford Cage 06/11/23 to discuss percutaneous cryoablation/biopsy of right renal mass and he presents today for the procedure.   Past Medical History:  Diagnosis Date   Cancer [facility])    kidney cancer   History of kidney stones    Pneumonia    05/2023- went to [facility]    Past Surgical History:  Procedure Laterality Date   IR RADIOLOGIST EVAL & MGMT  06/11/2023    Allergies: Patient has no known allergies.  Medications: Prior to Admission medications   Medication Sig Start Date End Date Taking? Authorizing Provider  HYDROcodone-acetaminophen (NORCO/VICODIN) 5-325 MG per tablet Take 1 tablet by mouth every 4 (four) hours as needed. Patient not taking: Reported on 07/21/2023 04/03/13   Janne Napoleon, NP  naproxen (NAPROSYN) 500 MG tablet Take 1 po BID with food prn pain Patient not taking: Reported on 07/21/2023 02/13/18   Devoria Albe, MD  ondansetron (ZOFRAN) 4 MG tablet Take 1 tablet (4 mg total) by mouth every 8 (eight) hours as needed for nausea or  vomiting. Patient not taking: Reported on 07/21/2023 02/13/18   Devoria Albe, MD  oxyCODONE-acetaminophen (PERCOCET/ROXICET) 5-325 MG tablet Take 1 tablet by mouth every 6 (six) hours as needed for severe pain. Patient not taking: Reported on 07/21/2023 02/13/18   Devoria Albe, MD  predniSONE (DELTASONE) 10 MG tablet Take 2 tablets (20 mg total) by mouth 2 (two) times daily. Patient not taking: Reported on 07/21/2023 04/19/22   Geoffery Lyons, MD  tamsulosin (FLOMAX) 0.4 MG CAPS capsule Take 1 po QD until you pass the stone. Patient not taking: Reported on 07/21/2023 02/13/18   Devoria Albe, MD  valACYclovir (VALTREX) 1000 MG tablet Take one 1,000 mg three times a day for 7 days Patient not taking: Reported on 07/21/2023 04/03/13   Janne Napoleon, NP     No family history on file.  Social History   Socioeconomic History   Marital status: Married    Spouse name: Not on file   Number of children: Not on file   Years of education: Not on file   Highest education level: Not on file  Occupational History   Not on file  Tobacco Use   Smoking status: Every Day    Current packs/day: 2.00    Types: Cigarettes   Smokeless tobacco: Never  Vaping Use   Vaping status: Never Used  Substance and Sexual Activity   Alcohol use: Never   Drug use: Not Currently    Types: Marijuana    Comment: hx of 20 years ago   Sexual activity: Not on file  Comment: rare  Other Topics Concern   Not on file  Social History Narrative   Not on file   Social Drivers of Health   Financial Resource Strain: Not on file  Food Insecurity: Not on file  Transportation Needs: Not on file  Physical Activity: Not on file  Stress: Not on file  Social Connections: Not on file      Review of Systems denies fever,HA,CP,dyspnea, cough, abd/back pain,N/V or bleeding; he is anxious  Vital Signs:temp 98.2  BP 133/86  HR 74  O2 SATS 95% RA      Physical Exam: awake/alert; chest- distant BS bilat; heart- RRR;  abd-soft,+BS,NT; no LE edema  Imaging: DG Chest 1 View Result Date: 08/01/2023 CLINICAL DATA:  58 year old male preoperative chest x-ray EXAM: CHEST  1 VIEW COMPARISON:  04/27/2023 FINDINGS: Cardiomediastinal silhouette unchanged in size and contour. No evidence of central vascular congestion. No interlobular septal thickening. Interval resolution of the opacity associated with the minor fissure. No pneumothorax or pleural effusion. Coarsened interstitial markings, with no confluent airspace disease. No acute displaced fracture. Degenerative changes of the spine. IMPRESSION: Negative for acute cardiopulmonary disease Electronically Signed   By: Gilmer Mor D.O.   On: 08/01/2023 16:25    Labs:  CBC: Recent Labs    04/27/23 2141 07/28/23 1121  WBC 10.4 8.0  HGB 16.1 17.4*  HCT 47.5 51.4  PLT 296 174    COAGS: Recent Labs    07/28/23 1121  INR 1.0    BMP: Recent Labs    04/27/23 2141 07/28/23 1121  NA 139 137  K 4.2 4.7  CL 107 107  CO2 24 23  GLUCOSE 97 100*  BUN 13 12  CALCIUM 9.1 9.5  CREATININE 0.80 0.80  GFRNONAA >60 >60    LIVER FUNCTION TESTS: Recent Labs    04/27/23 2141  BILITOT 0.4  AST 25  ALT 27  ALKPHOS 78  PROT 6.7  ALBUMIN 3.3*    TUMOR MARKERS: No results for input(s): "AFPTM", "CEA", "CA199", "CHROMGRNA" in the last 8760 hours.  Assessment and Plan: 58 y.o. male with history of tobacco abuse, and renal mass. Patient was referred to Dr. Milford Cage by Malen Gauze MD for a right renal mass. Patient initially presented to the ER at [facility] 04/27/23 with a cough. Chest CT reported a heterogeneously enhancing 3.5 cm mass within the visualized upper pole of the right kidney. This lesion was suspicious for a primary renal neoplasm. Follow up MRI abdomen/pelvis 10/28 reported the same heterogeneously enhancing mass in the medial superior post of the right kidney measuring 3.8 x 3.4 cm. The finding on MRI was consistent with renal cell carcinoma.  Patient followed up with Dr. Milford Cage 06/11/23 to discuss percutaneous cryoablation/biopsy of right renal mass and he presents today for the procedure.  Risks and benefits of image guided renal ablation was discussed with the patient /spouse  including, but not limited to bleeding, infection, damage to adjacent structures or low yield requiring additional tests.  All of the questions were answered and there is agreement to proceed.  Consent signed and in chart.  This procedure involves the use of CT and because of the nature of the planned procedure, it is possible that we will have prolonged use of CT  Potential radiation risks to you include (but are not limited to) the following: - A slightly elevated risk for cancer  several years later in life. This risk is typically less than 0.5% percent. This risk is  low in comparison to the normal incidence of human cancer, which is 33% for women and 50% for men according to the American [employer] Society. - Radiation induced injury can include skin redness, resembling a rash, tissue breakdown / ulcers and hair loss (which can be temporary or permanent).   The likelihood of either of these occurring depends on the difficulty of the procedure and whether you are sensitive to radiation due to previous procedures, disease, or genetic conditions.   IF your procedure requires a prolonged use of radiation, you will be notified and given written instructions for further action.  It is your responsibility to monitor the irradiated area for the 2 weeks following the procedure and to notify your physician if you are concerned that you have suffered a radiation induced injury.     Thank you for this interesting consult.  I greatly enjoyed meeting Andre Donovan and look forward to participating in their care.  A copy of this report was sent to the requesting provider on this date.  Electronically Signed: Rosalita Levan, PA/Kevin Rondal Vandevelde,PA-C 08/04/2023, 3:28  PM   I spent a total of  25 minutes in face to face in clinical consultation, greater than 50% of which was counseling/coordinating care for image guided cryoablation/possible biopsy of right renal mass

## 2023-08-04 NOTE — H&P (Deleted)
  The note originally documented on this encounter has been moved the the encounter in which it belongs.  

## 2023-08-05 ENCOUNTER — Other Ambulatory Visit (HOSPITAL_COMMUNITY): Payer: Self-pay | Admitting: Radiology

## 2023-08-05 NOTE — Anesthesia Preprocedure Evaluation (Addendum)
 Anesthesia Evaluation  Patient identified by MRN, date of birth, ID band Patient awake    Reviewed: Allergy & Precautions, H&P , NPO status , Patient's Chart, lab work & pertinent test results  Airway Mallampati: III  TM Distance: >3 FB Neck ROM: Full    Dental no notable dental hx. (+) Teeth Intact, Dental Advisory Given   Pulmonary Current Smoker   Pulmonary exam normal breath sounds clear to auscultation       Cardiovascular Exercise Tolerance: Good negative cardio ROS  Rhythm:Regular Rate:Normal     Neuro/Psych negative neurological ROS  negative psych ROS   GI/Hepatic negative GI ROS, Neg liver ROS,,,  Endo/Other  negative endocrine ROS    Renal/GU Renal disease  negative genitourinary   Musculoskeletal   Abdominal   Peds  Hematology negative hematology ROS (+)   Anesthesia Other Findings   Reproductive/Obstetrics negative OB ROS                             Anesthesia Physical Anesthesia Plan  ASA: 2  Anesthesia Plan: General   Post-op Pain Management: Tylenol  PO (pre-op)*   Induction: Intravenous  PONV Risk Score and Plan: 2 and Ondansetron , Dexamethasone  and Midazolam   Airway Management Planned: Oral ETT  Additional Equipment:   Intra-op Plan:   Post-operative Plan: Extubation in OR  Informed Consent: I have reviewed the patients History and Physical, chart, labs and discussed the procedure including the risks, benefits and alternatives for the proposed anesthesia with the patient or authorized representative who has indicated his/her understanding and acceptance.     Dental advisory given  Plan Discussed with: CRNA  Anesthesia Plan Comments:        Anesthesia Quick Evaluation

## 2023-08-06 ENCOUNTER — Other Ambulatory Visit (HOSPITAL_COMMUNITY): Payer: Self-pay

## 2023-08-06 ENCOUNTER — Encounter (HOSPITAL_COMMUNITY)
Admission: RE | Disposition: A | Discharge status: Home / Self Care | Payer: Self-pay | Source: Ambulatory Visit | Attending: Interventional Radiology

## 2023-08-06 ENCOUNTER — Encounter (HOSPITAL_COMMUNITY): Payer: Self-pay | Admitting: Interventional Radiology

## 2023-08-06 ENCOUNTER — Ambulatory Visit (HOSPITAL_COMMUNITY)
Admission: RE | Admit: 2023-08-06 | Discharge: 2023-08-06 | Disposition: A | Discharge status: Home / Self Care | Payer: Self-pay | Source: Ambulatory Visit | Attending: Interventional Radiology | Admitting: Interventional Radiology

## 2023-08-06 ENCOUNTER — Ambulatory Visit (HOSPITAL_COMMUNITY): Payer: Self-pay | Admitting: Physician Assistant

## 2023-08-06 ENCOUNTER — Other Ambulatory Visit: Payer: Self-pay

## 2023-08-06 ENCOUNTER — Ambulatory Visit (HOSPITAL_BASED_OUTPATIENT_CLINIC_OR_DEPARTMENT_OTHER): Payer: Self-pay | Admitting: Registered Nurse

## 2023-08-06 DIAGNOSIS — N2889 Other specified disorders of kidney and ureter: Secondary | ICD-10-CM

## 2023-08-06 DIAGNOSIS — C641 Malignant neoplasm of right kidney, except renal pelvis: Secondary | ICD-10-CM | POA: Insufficient documentation

## 2023-08-06 DIAGNOSIS — Z87442 Personal history of urinary calculi: Secondary | ICD-10-CM | POA: Insufficient documentation

## 2023-08-06 HISTORY — PX: RADIOLOGY WITH ANESTHESIA: SHX6223

## 2023-08-06 SURGERY — CT WITH ANESTHESIA
Anesthesia: General | Laterality: Right

## 2023-08-06 MED ORDER — SODIUM CHLORIDE (PF) 0.9 % IJ SOLN
INTRAMUSCULAR | Status: AC
  Filled 2023-08-06: qty 50

## 2023-08-06 MED ORDER — ACETAMINOPHEN 500 MG PO TABS
1000.0000 mg | ORAL_TABLET | Freq: Once | ORAL | Status: AC
  Administered 2023-08-06: 1000 mg via ORAL
  Filled 2023-08-06: qty 2

## 2023-08-06 MED ORDER — ROCURONIUM BROMIDE 10 MG/ML (PF) SYRINGE
PREFILLED_SYRINGE | INTRAVENOUS | Status: DC | PRN
  Administered 2023-08-06: 10 mg via INTRAVENOUS
  Administered 2023-08-06: 20 mg via INTRAVENOUS
  Administered 2023-08-06: 60 mg via INTRAVENOUS
  Administered 2023-08-06: 10 mg via INTRAVENOUS

## 2023-08-06 MED ORDER — SODIUM CHLORIDE 0.9 % IV SOLN
INTRAVENOUS | Status: AC
Start: 2023-08-06 — End: ?
  Filled 2023-08-06: qty 250

## 2023-08-06 MED ORDER — ONDANSETRON HCL 4 MG/2ML IJ SOLN
INTRAMUSCULAR | Status: DC | PRN
  Administered 2023-08-06: 4 mg via INTRAVENOUS

## 2023-08-06 MED ORDER — MIDAZOLAM HCL 2 MG/2ML IJ SOLN
INTRAMUSCULAR | Status: AC
Start: 2023-08-06 — End: ?
  Filled 2023-08-06: qty 2

## 2023-08-06 MED ORDER — FENTANYL CITRATE (PF) 100 MCG/2ML IJ SOLN
INTRAMUSCULAR | Status: AC
  Filled 2023-08-06: qty 2

## 2023-08-06 MED ORDER — LACTATED RINGERS IV SOLN: INTRAVENOUS | Status: DC

## 2023-08-06 MED ORDER — IOHEXOL 300 MG/ML  SOLN
100.0000 mL | Freq: Once | INTRAMUSCULAR | Status: AC | PRN
  Administered 2023-08-06: 100 mL via INTRAVENOUS

## 2023-08-06 MED ORDER — LIDOCAINE 2% (20 MG/ML) 5 ML SYRINGE
INTRAMUSCULAR | Status: DC | PRN
  Administered 2023-08-06: 60 mg via INTRAVENOUS

## 2023-08-06 MED ORDER — ONDANSETRON HCL 4 MG PO TABS
4.0000 mg | ORAL_TABLET | Freq: Three times a day (TID) | ORAL | 0 refills | Status: DC | PRN
  Filled 2023-08-06: qty 10, 4d supply, fill #0

## 2023-08-06 MED ORDER — SODIUM CHLORIDE 0.9 % IV SOLN
2.0000 g | Freq: Once | INTRAVENOUS | Status: AC
  Administered 2023-08-06: 2 g via INTRAVENOUS
  Filled 2023-08-06: qty 20

## 2023-08-06 MED ORDER — BUPIVACAINE HCL (PF) 0.5 % IJ SOLN
INTRAMUSCULAR | Status: AC
  Filled 2023-08-06: qty 30

## 2023-08-06 MED ORDER — PROPOFOL 10 MG/ML IV BOLUS
INTRAVENOUS | Status: DC | PRN
  Administered 2023-08-06: 120 mg via INTRAVENOUS

## 2023-08-06 MED ORDER — PHENYLEPHRINE HCL-NACL 20-0.9 MG/250ML-% IV SOLN
INTRAVENOUS | Status: DC | PRN
  Administered 2023-08-06: 25 ug/min via INTRAVENOUS

## 2023-08-06 MED ORDER — FENTANYL CITRATE (PF) 100 MCG/2ML IJ SOLN
INTRAMUSCULAR | Status: DC | PRN
  Administered 2023-08-06: 50 ug via INTRAVENOUS
  Administered 2023-08-06: 100 ug via INTRAVENOUS
  Administered 2023-08-06: 50 ug via INTRAVENOUS

## 2023-08-06 MED ORDER — OXYCODONE HCL 5 MG PO TABS
10.0000 mg | ORAL_TABLET | Freq: Four times a day (QID) | ORAL | 0 refills | Status: DC | PRN
  Filled 2023-08-06: qty 10, 2d supply, fill #0

## 2023-08-06 MED ORDER — DEXAMETHASONE SODIUM PHOSPHATE 10 MG/ML IJ SOLN
INTRAMUSCULAR | Status: DC | PRN
  Administered 2023-08-06: 8 mg via INTRAVENOUS

## 2023-08-06 MED ORDER — DOCUSATE SODIUM 100 MG PO CAPS
100.0000 mg | ORAL_CAPSULE | Freq: Every day | ORAL | 0 refills | Status: DC
  Filled 2023-08-06: qty 7, 7d supply, fill #0

## 2023-08-06 MED ORDER — SODIUM CHLORIDE 0.9 % IV SOLN
INTRAVENOUS | Status: AC
  Filled 2023-08-06: qty 250

## 2023-08-06 MED ORDER — MIDAZOLAM HCL 5 MG/5ML IJ SOLN
INTRAMUSCULAR | Status: DC | PRN
  Administered 2023-08-06: 2 mg via INTRAVENOUS

## 2023-08-06 MED ORDER — SUGAMMADEX SODIUM 200 MG/2ML IV SOLN
INTRAVENOUS | Status: DC | PRN
  Administered 2023-08-06: 200 mg via INTRAVENOUS

## 2023-08-06 MED ORDER — FENTANYL CITRATE PF 50 MCG/ML IJ SOSY: 25.0000 ug | PREFILLED_SYRINGE | INTRAMUSCULAR | Status: DC | PRN

## 2023-08-06 NOTE — Discharge Summary (Signed)
 Patient ID: Andre Donovan MRN: [id number] DOB/AGE: 03/18/1966 58 y.o.  Admit date: 08/06/2023 Discharge date: 08/06/2023  Supervising Physician: Art Largo  Patient Status: [facility]  Admission Diagnoses: right renal mass  Discharge Diagnoses: right renal mass, s/p successful CT-guided biopsy of R renal mass on 08/06/23 via general anesthesia. A total of 3 samples were obtained. Successful CT-guided cryoablation right renal mass  using 3x IceForce 2.1 CX probe. Contrasted post-ablation CT with adequate lesional coverage    Active Problems:   * No active hospital problems. *  Past Medical History:  Diagnosis Date   Cancer (HCC)    kidney cancer   History of kidney stones    Pneumonia    05/2023- went to [facility]   Past Surgical History:  Procedure Laterality Date   IR RADIOLOGIST EVAL & MGMT  06/11/2023     Discharged Condition: good  Hospital Course: Andre Donovan is a 58 y.o. male with history of tobacco abuse, and right renal mass. Patient was referred to Dr. Darylene Epley by Marco Severs MD for a right renal mass. Patient initially presented to the ER at [facility] 04/27/23 with a cough. Chest CT reported a heterogeneously enhancing 3.5 cm mass within the visualized upper pole of the right kidney. This lesion was suspicious for a primary renal neoplasm. Follow up MRI abdomen/pelvis 10/28 reported the same heterogeneously enhancing mass in the medial superior post of the right kidney measuring 3.8 x 3.4 cm. The finding on MRI was consistent with renal cell carcinoma. Patient followed up with Dr. Darylene Epley 06/11/23 to discuss percutaneous cryoablation/biopsy of right renal mass and he underwent CT guided percutaneous cryoablation/biopsy of the right renal mass via general anesthesia on 08/06/23. The procedure was performed without immediate complications. The pt was subsequently extubated, and monitored in PACU for 3 hours post procedure. Foley cath was removed. Yellow urine noted in  bag. Pt was seen in PACU by Dr. Darylene Epley post procedure. Pt was stable with no reports of fever,HA,CP,dyspnea, cough, sig abd/back pain,N/V or bleeding; he did have some sl throat soreness from recent ET tube placement. He was able to tolerate his diet, void and ambulate without difficulty. Right flank puncture site was clean and dry, NT,  no hematoma noted. He was deemed stable for dc home. Pt will resume home meds and electronic prescriptions for oxycodone  IR 10 mg every 6 hours as needed for moderate- severe  pain,#10, no refills; zofran  4 mg every 8 hours as needed for nausea,#10 , no refills and colace 100  mg daily, #7, no refills as needed for constipation were sent to [facility] OP pharmacy. IR team will f/u with pt in 1 month. Pt will cont urologic care with Dr. Claretta Croft as scheduled. Pt was told to contact IR team with any additional questions.   Consults: anesthesia  Significant Diagnostic Studies:  Results for orders placed or performed during the hospital encounter of 07/28/23  Protime-INR   Collection Time: 07/28/23 11:21 AM  Result Value Ref Range   Prothrombin Time 13.6 11.4 - 15.2 seconds   INR 1.0 0.8 - 1.2  CBC with Differential/Platelet   Collection Time: 07/28/23 11:21 AM  Result Value Ref Range   WBC 8.0 4.0 - 10.5 K/uL   RBC 5.44 4.22 - 5.81 MIL/uL   Hemoglobin 17.4 (H) 13.0 - 17.0 g/dL   HCT 78.2 95.6 - 21.3 %   MCV 94.5 80.0 - 100.0 fL   MCH 32.0 26.0 - 34.0 pg   MCHC 33.9  30.0 - 36.0 g/dL   RDW 16.1 09.6 - 04.5 %   Platelets 174 150 - 400 K/uL   nRBC 0.0 0.0 - 0.2 %   Neutrophils Relative % 63 %   Neutro Abs 5.0 1.7 - 7.7 K/uL   Lymphocytes Relative 28 %   Lymphs Abs 2.3 0.7 - 4.0 K/uL   Monocytes Relative 6 %   Monocytes Absolute 0.5 0.1 - 1.0 K/uL   Eosinophils Relative 2 %   Eosinophils Absolute 0.1 0.0 - 0.5 K/uL   Basophils Relative 1 %   Basophils Absolute 0.1 0.0 - 0.1 K/uL   Immature Granulocytes 0 %   Abs Immature Granulocytes 0.03 0.00 - 0.07 K/uL   Basic metabolic panel   Collection Time: 07/28/23 11:21 AM  Result Value Ref Range   Sodium 137 135 - 145 mmol/L   Potassium 4.7 3.5 - 5.1 mmol/L   Chloride 107 98 - 111 mmol/L   CO2 23 22 - 32 mmol/L   Glucose, Bld 100 (H) 70 - 99 mg/dL   BUN 12 6 - 20 mg/dL   Creatinine, Ser 4.09 0.61 - 1.24 mg/dL   Calcium 9.5 8.9 - 81.1 mg/dL   GFR, Estimated >91 >47 mL/min   Anion gap 7 5 - 15     Treatments: s/p successful CT-guided biopsy of R renal mass on 08/06/23 via general anesthesia. A total of 3 samples were obtained. Successful CT-guided cryoablation right renal mass  using 3x IceForce 2.1 CX probe. Contrasted post-ablation CT with adequate lesional coverage  Discharge Exam: Blood pressure 139/78, pulse 72, temperature (!) 97.5 F (36.4 C), resp. rate 12, height 6' (1.829 m), weight 224 lb 13.9 oz (102 kg), SpO2 97%. Awake/alert; chest- CTA bilat; heart- RRR; abd-soft,+BS,NT; puncture site rt flank clean and dry,NT, no hematoma; no LE edema  Disposition: Discharge disposition: 01-Home or Self Care       Discharge Instructions     Call MD for:  difficulty breathing, headache or visual disturbances   Complete by: As directed    Call MD for:  extreme fatigue   Complete by: As directed    Call MD for:  hives   Complete by: As directed    Call MD for:  persistant dizziness or light-headedness   Complete by: As directed    Call MD for:  persistant nausea and vomiting   Complete by: As directed    Call MD for:  redness, tenderness, or signs of infection (pain, swelling, redness, odor or green/yellow discharge around incision site)   Complete by: As directed    Call MD for:  severe uncontrolled pain   Complete by: As directed    Call MD for:  temperature >100.4   Complete by: As directed    Change dressing (specify)   Complete by: As directed    May change gauze dressing over right flank region and apply new bandage in 24 hours; may change daily for next 2-3 days; may wash  site with soap and water   Diet - low sodium heart healthy   Complete by: As directed    Discharge instructions   Complete by: As directed    May resume home medications; stay well hydrated; avoid strenuous activity /heavy lifting for next 3-4 days   Driving Restrictions   Complete by: As directed    No driving for next 24 hours   Increase activity slowly   Complete by: As directed    Lifting restrictions   Complete by:  As directed    Avoid heavy lifting for next 3-4 days   May shower / Bathe   Complete by: As directed    May walk up steps   Complete by: As directed       Allergies as of 08/06/2023   No Known Allergies      Medication List     STOP taking these medications    HYDROcodone -acetaminophen  5-325 MG tablet Commonly known as: NORCO/VICODIN   naproxen  500 MG tablet Commonly known as: NAPROSYN    oxyCODONE -acetaminophen  5-325 MG tablet Commonly known as: PERCOCET/ROXICET   predniSONE  10 MG tablet Commonly known as: DELTASONE    tamsulosin  0.4 MG Caps capsule Commonly known as: Flomax    valACYclovir  1000 MG tablet Commonly known as: VALTREX        TAKE these medications    docusate sodium  100 MG capsule Commonly known as: Colace Take 1 capsule (100 mg total) by mouth daily.   ondansetron  4 MG tablet Commonly known as: ZOFRAN  Take 1 tablet (4 mg total) by mouth every 8 (eight) hours as needed for nausea or vomiting. What changed: Another medication with the same name was added. Make sure you understand how and when to take each.   ondansetron  4 MG tablet Commonly known as: Zofran  Take 1 tablet (4 mg total) by mouth every 8 (eight) hours as needed for nausea or vomiting. What changed: You were already taking a medication with the same name, and this prescription was added. Make sure you understand how and when to take each.   oxyCODONE  5 MG immediate release tablet Commonly known as: Oxy IR/ROXICODONE  Take 2 tablets (10 mg total) by mouth every 6  (six) hours as needed for severe pain (pain score 7-10).               Discharge Care Instructions  (From admission, onward)           Start     Ordered   08/06/23 0000  Change dressing (specify)       Comments: May change gauze dressing over right flank region and apply new bandage in 24 hours; may change daily for next 2-3 days; may wash site with soap and water   08/06/23 1431            Follow-up Information     Art Largo, MD Follow up.   Specialties: Interventional Radiology, Diagnostic Radiology, Radiology Why: Will contact you regarding follow up with Dr. Darylene Epley in 1 month; call [phone] or [phone] with any questions Contact information: [address] [place] [address] [phone]         Marco Severs, MD Follow up.   Specialty: Urology Why: follow up with Dr. Claretta Croft as scheduled Contact information: [address]  [address] [place] [address] [phone]                  Electronically Signed: D. Honore Lux, PA-C 08/06/2023, 2:36 PM   I have spent Less Than 30 Minutes discharging Andre Donovan.

## 2023-08-06 NOTE — Anesthesia Procedure Notes (Signed)
 Procedure Name: Intubation Date/Time: 08/06/2023 9:07 AM  Performed by: Marshall Skeeter, CRNAPre-anesthesia Checklist: Patient identified, Emergency Drugs available, Suction available, Patient being monitored and Timeout performed Patient Re-evaluated:Patient Re-evaluated prior to induction Oxygen Delivery Method: Circle system utilized Preoxygenation: Pre-oxygenation with 100% oxygen Induction Type: IV induction Ventilation: Mask ventilation without difficulty and Oral airway inserted - appropriate to patient size Laryngoscope Size: Mac and 4 Grade View: Grade II Tube type: Oral Tube size: 7.5 mm Number of attempts: 1 Airway Equipment and Method: Stylet Placement Confirmation: ETT inserted through vocal cords under direct vision, positive ETCO2 and breath sounds checked- equal and bilateral Secured at: 22 cm Tube secured with: Tape Dental Injury: Teeth and Oropharynx as per pre-operative assessment

## 2023-08-06 NOTE — Procedures (Signed)
 Vascular and Interventional Radiology Procedure Note  Patient: Andre Donovan DOB: July 28, 1965 Medical Record Number: [id number] Note Date/Time: 08/06/23 9:35 AM   Performing Physician: Art Largo, MD Assistant(s): None  Diagnosis: R renal mass c/f RCC   Procedure:  PERCUTANEOUS CRYOABLATION of RIGHT RENAL MASS RIGHT RENAL MASS BIOPSY     Anesthesia: General Anesthesia Complications: None Estimated Blood Loss: Minimal Specimens: Sent for Pathology    Findings:  Successful CT-guided biopsy of R renal mass. A total of 3 samples were obtained.   Successful CT-guided cryoablation using 3x IceForce 2.1 CX probe. Contrasted post-ablation CT with adequate lesional coverage.  Hemostasis of the tract was achieved using Manual Pressure.   Plan: Bed rest for 3 hours.    See detailed procedure note with images in PACS. The patient tolerated the procedure well without incident or complication and was returned to PACU in stable condition.    Art Largo, MD [facility]   Pager. [phone] Clinic. [phone]

## 2023-08-06 NOTE — Transfer of Care (Signed)
 Immediate Anesthesia Transfer of Care Note  Patient: Andre Donovan  Procedure(s) Performed: CT WITH ANESTHESIA CRYOABLATION OF RIGHT RENAL MASS (Right)  Patient Location: PACU  Anesthesia Type:General  Level of Consciousness: awake, alert , oriented, and patient cooperative  Airway & Oxygen Therapy: Patient Spontanous Breathing and Patient connected to face mask oxygen  Post-op Assessment: Report given to RN, Post -op Vital signs reviewed and stable, and Patient moving all extremities  Post vital signs: Reviewed and stable  Last Vitals:  Vitals Value Taken Time  BP 139/82 08/06/23 1228  Temp    Pulse 80 08/06/23 1229  Resp 22 08/06/23 1229  SpO2 100 % 08/06/23 1229  Vitals shown include unfiled device data.  Last Pain:  Vitals:   08/06/23 0703  TempSrc: Oral  PainSc: 0-No pain         Complications: No notable events documented.

## 2023-08-06 NOTE — Plan of Care (Cosign Needed Addendum)
 Per order of Dr. Darylene Epley, electronic prescriptions were sent to [facility] pharmacy for oxycodone  IR 10 mg every 6 hrs as needed for moderate pain, #10, no refills;  zofran  4 mg every 8 hrs , #10, no refills; colace 100 mg once daily ,#7, no refills.

## 2023-08-06 NOTE — Discharge Instructions (Addendum)
 May resume home medications, avoid heavy lifting for next 3-4 days, stay well hydrated

## 2023-08-07 ENCOUNTER — Encounter (HOSPITAL_COMMUNITY): Payer: Self-pay | Admitting: Interventional Radiology

## 2023-08-07 LAB — SURGICAL PATHOLOGY

## 2023-08-07 NOTE — Anesthesia Postprocedure Evaluation (Signed)
Anesthesia Post Note  Patient: Andre Donovan  Procedure(s) Performed: CT WITH ANESTHESIA CRYOABLATION OF RIGHT RENAL MASS (Right)     Patient location during evaluation: PACU Anesthesia Type: General Level of consciousness: awake and alert Pain management: pain level controlled Vital Signs Assessment: post-procedure vital signs reviewed and stable Respiratory status: spontaneous breathing, nonlabored ventilation and respiratory function stable Cardiovascular status: blood pressure returned to baseline and stable Postop Assessment: no apparent nausea or vomiting Anesthetic complications: no   No notable events documented.  Last Pain:  Vitals:   08/06/23 1500  TempSrc:   PainSc: 0-No pain                 Jeremy Ditullio,W. EDMOND

## 2023-08-26 ENCOUNTER — Inpatient Hospital Stay: Payer: Self-pay

## 2023-08-26 ENCOUNTER — Inpatient Hospital Stay: Payer: Self-pay | Attending: Hematology | Admitting: Hematology

## 2023-08-26 VITALS — BP 107/72 | HR 63 | Temp 96.7°F | Resp 20 | Wt 225.3 lb

## 2023-08-26 DIAGNOSIS — Z87442 Personal history of urinary calculi: Secondary | ICD-10-CM | POA: Insufficient documentation

## 2023-08-26 DIAGNOSIS — N2889 Other specified disorders of kidney and ureter: Secondary | ICD-10-CM

## 2023-08-26 DIAGNOSIS — C641 Malignant neoplasm of right kidney, except renal pelvis: Secondary | ICD-10-CM | POA: Insufficient documentation

## 2023-08-26 DIAGNOSIS — Z806 Family history of leukemia: Secondary | ICD-10-CM | POA: Insufficient documentation

## 2023-08-26 DIAGNOSIS — Z8701 Personal history of pneumonia (recurrent): Secondary | ICD-10-CM | POA: Insufficient documentation

## 2023-08-26 LAB — COMPREHENSIVE METABOLIC PANEL
ALT: 22 U/L (ref 0–44)
AST: 23 U/L (ref 15–41)
Albumin: 3.7 g/dL (ref 3.5–5.0)
Alkaline Phosphatase: 80 U/L (ref 38–126)
Anion gap: 10 (ref 5–15)
BUN: 15 mg/dL (ref 6–20)
CO2: 23 mmol/L (ref 22–32)
Calcium: 9.1 mg/dL (ref 8.9–10.3)
Chloride: 104 mmol/L (ref 98–111)
Creatinine, Ser: 0.87 mg/dL (ref 0.61–1.24)
GFR, Estimated: >60 mL/min (ref >60)
Glucose, Bld: 148 mg/dL — ABNORMAL HIGH (ref 70–99)
Potassium: 3.8 mmol/L (ref 3.5–5.1)
Sodium: 137 mmol/L (ref 135–145)
Total Bilirubin: 0.8 mg/dL (ref 0.0–1.2)
Total Protein: 6.6 g/dL (ref 6.5–8.1)

## 2023-08-26 LAB — CBC WITH DIFFERENTIAL/PLATELET
Abs Immature Granulocytes: 0.02 10*3/uL (ref 0.00–0.07)
Basophils Absolute: 0 10*3/uL (ref 0.0–0.1)
Basophils Relative: 0 %
Eosinophils Absolute: 0.1 10*3/uL (ref 0.0–0.5)
Eosinophils Relative: 2 %
HCT: 47.3 % (ref 39.0–52.0)
Hemoglobin: 16.4 g/dL (ref 13.0–17.0)
Immature Granulocytes: 0 %
Lymphocytes Relative: 31 %
Lymphs Abs: 2.4 10*3/uL (ref 0.7–4.0)
MCH: 32.1 pg (ref 26.0–34.0)
MCHC: 34.7 g/dL (ref 30.0–36.0)
MCV: 92.6 fL (ref 80.0–100.0)
Monocytes Absolute: 0.5 10*3/uL (ref 0.1–1.0)
Monocytes Relative: 7 %
Neutro Abs: 4.7 10*3/uL (ref 1.7–7.7)
Neutrophils Relative %: 60 %
Platelets: 196 10*3/uL (ref 150–400)
RBC: 5.11 MIL/uL (ref 4.22–5.81)
RDW: 12.5 % (ref 11.5–15.5)
WBC: 7.9 10*3/uL (ref 4.0–10.5)
nRBC: 0 % (ref 0.0–0.2)

## 2023-08-26 NOTE — Patient Instructions (Addendum)
 [facility] at [facility] Discharge Instructions   You were seen and examined today by Dr. Rogers.  He reviewed the results of your lab work which are normal/stable.   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   We will see you back in 3 months. We will repeat lab work and a CT scan prior to this visit.   Return as scheduled.    Thank you for choosing [facility] at [facility] to provide your oncology and hematology care.  To afford each patient quality time with our provider, please arrive at least 15 minutes before your scheduled appointment time.   If you have a lab appointment with the Cancer Center please come in thru the Main Entrance and check in at the main information desk.  You need to re-schedule your appointment should you arrive 10 or more minutes late.  We strive to give you quality time with our providers, and arriving late affects you and other patients whose appointments are after yours.  Also, if you no show three or more times for appointments you may be dismissed from the clinic at the providers discretion.     Again, thank you for choosing [facility].  Our hope is that these requests will decrease the amount of time that you wait before being seen by our physicians.       _____________________________________________________________  Should you have questions after your visit to [facility], please contact our office at [phone] and follow the prompts.  Our office hours are 8:00 a.m. and 4:30 p.m. Monday - Friday.  Please note that voicemails left after 4:00 p.m. may not be returned until the following business day.  We are closed weekends and major holidays.  You do have access to a nurse 24-7, just call the main number to the clinic [phone] and do not press any options, hold on the line and a nurse will answer the phone.    For  prescription refill requests, have your pharmacy contact our office and allow 72 hours.    Due to Covid, you will need to wear a mask upon entering the hospital. If you do not have a mask, a mask will be given to you at the Main Entrance upon arrival. For doctor visits, patients may have 1 support person age 17 or older with them. For treatment visits, patients can not have anyone with them due to social distancing guidelines and our immunocompromised population.

## 2023-08-26 NOTE — Progress Notes (Signed)
 [facility] [address] [place] [address]    Clinic Day:  08/26/2023  Referring physician: No ref. provider found  Patient Care Team: Patient, No Pcp Per as PCP - General (General Practice)   ASSESSMENT & PLAN:   Assessment: 1.  Right kidney clear-cell renal cell carcinoma: - Patient seen in the ER on 04/27/2023 with weakness and was diagnosed with pneumonia. - CT chest (04/27/2023): Incidental heterogeneously enhancing 3.5 cm mass within the visualized upper pole of the right kidney, incompletely included in the exam suspicious for primary renal neoplasm. - No hematuria/B symptoms. - MRI abdomen (05/19/2023): Heterogeneous enhancing mass arising from the medial superior pole of right kidney measuring 3.8 x 3.4 cm with no evidence of renal vein invasion.  No lymphadenopathy or metastatic disease. - 08/06/2023: Right kidney mass biopsy and percutaneous cryoablation - Pathology: Clear-cell renal cell carcinoma, grade 2   2.  Social/family history: - Lives at home with his wife.  Works in holiday representative and building houses.  Has exposure to paints.  Current active smoker, 2 pack/day, started at age 40. - Mother died of acute leukemia.  Plan: 1.  Right kidney clear-cell RCC: - He underwent biopsy which showed clear-cell renal cell carcinoma followed by percutaneous cryoablation on 08/06/2023. - He has tolerated the procedure very well.  He denies any hematuria or pain at this time. - We discussed surveillance plan.  Recommend follow-up in 3 months with CT abdomen renal protocol with and without contrast.    Orders Placed This Encounter  Procedures   CT RENAL ABD W/WO    Standing Status:   Future    Expected Date:   11/23/2023    Expiration Date:   08/25/2024    If indicated for the ordered procedure, I authorize the administration of contrast media per Radiology protocol:   Yes    Does the patient have a contrast media/X-ray dye allergy?:   No    Preferred imaging  location?:   [facility] Teague,acting as a scribe for Alean Stands, MD.,have documented all relevant documentation on the behalf of Alean Stands, MD,as directed by  Alean Stands, MD while in the presence of Alean Stands, MD.  I, Alean Stands MD, have reviewed the above documentation for accuracy and completeness, and I agree with the above.    Alean Stands, MD   2/4/20254:31 PM  CHIEF COMPLAINT:   Diagnosis: Right kidney mass consistent with RCC   Cancer Staging  No matching staging information was found for the patient.    Prior Therapy: none  Current Therapy: Under workup   HISTORY OF PRESENT ILLNESS:   Oncology History   No history exists.     INTERVAL HISTORY:   Andre Donovan is a 58 y.o. male presenting to clinic today for follow up of right kidney mass. He was last seen by me on 05/26/23.  Since his last visit, he underwent CT cryoablation of right renal mass with biopsy on 08/06/23 with Dr. Hughes. Pathology of renal mass showed clear cell renal cell carcinoma.  Today, he states that he is doing well overall. His appetite level is at 100%. His energy level is at 90%. He is accompanied by his wife. He does not have follow-up with Dr. Sherrilee. He denies any hematuria, fevers, or chills.   PAST MEDICAL HISTORY:   Past Medical History: Past Medical History:  Diagnosis Date   Cancer (HCC)    kidney cancer  History of kidney stones    Pneumonia    05/2023- went to [facility]    Surgical History: Past Surgical History:  Procedure Laterality Date   IR RADIOLOGIST EVAL & MGMT  06/11/2023   RADIOLOGY WITH ANESTHESIA Right 08/06/2023   Procedure: CT WITH ANESTHESIA CRYOABLATION OF RIGHT RENAL MASS;  Surgeon: Hughes Simmonds, MD;  Location: [facility] ORS;  Service: Radiology;  Laterality: Right;    Social History: Social History   Socioeconomic History   Marital status: Married    Spouse name: Not on file    Number of children: Not on file   Years of education: Not on file   Highest education level: Not on file  Occupational History   Not on file  Tobacco Use   Smoking status: Every Day    Current packs/day: 2.00    Types: Cigarettes   Smokeless tobacco: Never  Vaping Use   Vaping status: Never Used  Substance and Sexual Activity   Alcohol use: Never   Drug use: Not Currently    Types: Marijuana    Comment: hx of 20 years ago   Sexual activity: Not on file    Comment: rare  Other Topics Concern   Not on file  Social History Narrative   Not on file   Social Drivers of Health   Financial Resource Strain: Not on file  Food Insecurity: Not on file  Transportation Needs: Not on file  Physical Activity: Not on file  Stress: Not on file  Social Connections: Not on file  Intimate Partner Violence: Not on file    Family History: No family history on file.  Current Medications:  Current Outpatient Medications:    docusate sodium  (COLACE) 100 MG capsule, Take 1 capsule (100 mg total) by mouth daily., Disp: 7 capsule, Rfl: 0   ondansetron  (ZOFRAN ) 4 MG tablet, Take 1 tablet (4 mg total) by mouth every 8 (eight) hours as needed for nausea or vomiting., Disp: 10 tablet, Rfl: 0   ondansetron  (ZOFRAN ) 4 MG tablet, Take 1 tablet (4 mg total) by mouth every 8 (eight) hours as needed for nausea or vomiting., Disp: 10 tablet, Rfl: 0   oxyCODONE  (OXY IR/ROXICODONE ) 5 MG immediate release tablet, Take 2 tablets (10 mg total) by mouth every 6 (six) hours as needed for severe pain (pain score 7-10)., Disp: 10 tablet, Rfl: 0   Allergies: No Known Allergies  REVIEW OF SYSTEMS:   Review of Systems  Constitutional:  Negative for chills, fatigue and fever.  HENT:   Negative for lump/mass, mouth sores, nosebleeds, sore throat and trouble swallowing.   Eyes:  Negative for eye problems.  Respiratory:  Negative for cough and shortness of breath.   Cardiovascular:  Negative for chest pain, leg  swelling and palpitations.  Gastrointestinal:  Negative for abdominal pain, constipation, diarrhea, nausea and vomiting.  Genitourinary:  Negative for bladder incontinence, difficulty urinating, dysuria, frequency, hematuria and nocturia.   Musculoskeletal:  Negative for arthralgias, back pain, flank pain, myalgias and neck pain.  Skin:  Negative for itching and rash.  Neurological:  Positive for dizziness. Negative for headaches and numbness.  Hematological:  Does not bruise/bleed easily.  Psychiatric/Behavioral:  Negative for depression, sleep disturbance and suicidal ideas. The patient is not nervous/anxious.   All other systems reviewed and are negative.    VITALS:   Blood pressure 107/72, pulse 63, temperature (!) 96.7 F (35.9 C), temperature source Tympanic, resp. rate 20, weight 225 lb 5 oz (102.2 kg), SpO2 97%.  Wt Readings from Last 3 Encounters:  08/26/23 225 lb 5 oz (102.2 kg)  08/06/23 224 lb 13.9 oz (102 kg)  07/28/23 227 lb (103 kg)    Body mass index is 30.56 kg/m.  Performance status (ECOG): 1 - Symptomatic but completely ambulatory  PHYSICAL EXAM:   Physical Exam Vitals and nursing note reviewed. Exam conducted with a chaperone present.  Constitutional:      Appearance: Normal appearance.  Cardiovascular:     Rate and Rhythm: Normal rate and regular rhythm.     Pulses: Normal pulses.     Heart sounds: Normal heart sounds.  Pulmonary:     Effort: Pulmonary effort is normal.     Breath sounds: Normal breath sounds.  Abdominal:     Palpations: Abdomen is soft. There is no hepatomegaly, splenomegaly or mass.     Tenderness: There is no abdominal tenderness.  Musculoskeletal:     Right lower leg: No edema.     Left lower leg: No edema.  Lymphadenopathy:     Cervical: No cervical adenopathy.     Right cervical: No superficial, deep or posterior cervical adenopathy.    Left cervical: No superficial, deep or posterior cervical adenopathy.     Upper Body:      Right upper body: No supraclavicular or axillary adenopathy.     Left upper body: No supraclavicular or axillary adenopathy.  Neurological:     General: No focal deficit present.     Mental Status: He is alert and oriented to person, place, and time.  Psychiatric:        Mood and Affect: Mood normal.        Behavior: Behavior normal.     LABS:      Latest Ref Rng & Units 08/26/2023    1:48 PM 07/28/2023   11:21 AM 04/27/2023    9:41 PM  CBC  WBC 4.0 - 10.5 K/uL 7.9  8.0  10.4   Hemoglobin 13.0 - 17.0 g/dL 83.5  82.5  83.8   Hematocrit 39.0 - 52.0 % 47.3  51.4  47.5   Platelets 150 - 400 K/uL 196  174  296       Latest Ref Rng & Units 08/26/2023    1:48 PM 07/28/2023   11:21 AM 04/27/2023    9:41 PM  CMP  Glucose 70 - 99 mg/dL 851  899  97   BUN 6 - 20 mg/dL 15  12  13    Creatinine 0.61 - 1.24 mg/dL 9.12  9.19  9.19   Sodium 135 - 145 mmol/L 137  137  139   Potassium 3.5 - 5.1 mmol/L 3.8  4.7  4.2   Chloride 98 - 111 mmol/L 104  107  107   CO2 22 - 32 mmol/L 23  23  24    Calcium 8.9 - 10.3 mg/dL 9.1  9.5  9.1   Total Protein 6.5 - 8.1 g/dL 6.6   6.7   Total Bilirubin 0.0 - 1.2 mg/dL 0.8   0.4   Alkaline Phos 38 - 126 U/L 80   78   AST 15 - 41 U/L 23   25   ALT 0 - 44 U/L 22   27      No results found for: CEA1, CEA / No results found for: CEA1, CEA No results found for: PSA1 No results found for: CAN199 No results found for: CAN125  No results found for: TOTALPROTELP, ALBUMINELP, A1GS, A2GS, BETS, BETA2SER, GAMS, MSPIKE, SPEI  No results found for: TIBC, FERRITIN, IRONPCTSAT No results found for: LDH   STUDIES:   CT GUIDE TISSUE ABLATION Result Date: 08/06/2023 CLINICAL DATA:  RT RENAL CRYO AND BX 58 y.o. male with history of tobacco abuse, and a RIGHT renal mass. EXAM: CT GUIDED ABLATION; CT BIOPSY CORE RENAL Procedures: 1. CT-GUIDED CORE BIOPSY OF RIGHT RENAL MASS 2. CT-GUIDED PERCUTANEOUS CRYOABLATION OF RIGHT RENAL MASS  ANESTHESIA/SEDATION: Sedation by the Anesthesia Team was performed. Please see anesthesiology log for details. MEDICATIONS: Rocephin  2 gm IV .The antibiotic was administered in an appropriate time interval prior to needle puncture of the skin. CONTRAST:  100mL OMNIPAQUE  IOHEXOL  300 MG/ML  SOLN PROCEDURE: RADIATION DOSE REDUCTION: This exam was performed according to the departmental dose-optimization program which includes automated exposure control, adjustment of the mA and/or kV according to patient size and/or use of iterative reconstruction technique. The procedure, risks, benefits, and alternatives were explained to the patient and/or patient's representative. Questions regarding the procedure were encouraged and answered. The patient understands and consents to the procedure. The patient was placed under general anesthesia. Initial un-enhanced CT was performed in a prone position to localize the superior pole cortical RIGHT renal mass. The patient's RIGHT flank was prepped with chlorhexidine  in a sterile fashion, and a sterile drape was applied covering the operative field. A sterile gown and sterile gloves were used for the procedure. Under CT guidance, 3x IceForce 2.1 CX percutaneous cryoablation probe was advanced into the RIGHT renal mass. Additionally, 1 hydrodissection needle was placed, to adequately separate the mass away from the diaphragm and RIGHT posterior chest wall. Probe positioning was confirmed by CT prior to cryoablation. A 17 gauge trocar needle was advanced into the RIGHT renal mass. Core biopsy was performed with an 18 gauge automated core biopsy device. A total of 3 core samples were submitted in formalin for pathologic analysis. Cryoablation was performed using a standard protocol. An initial 10 minute cycle of cryoablation was performed. This was followed by a 8 minute thaw cycle. A second 10 minute cycle of cryoablation was then performed. During ablation, periodic CT imaging was  performed to monitor ice ball formation and morphology. After active thaw, the cryoablation probes were removed. A contrasted post-procedural CT was performed. A dressing was placed. The patient tolerated the procedure well without immediate post procedural complication COMPLICATIONS: None immediate. FINDINGS: 3.8 cm RIGHT posterosuperior solid renal cortical mass. Technically successful CT guided biopsy and cryoablation with adequate lesional coverage by the ice ball. IMPRESSION: Successful CT-guided percutaneous core biopsy and cryoablation of the RIGHT renal mass. PLAN: The patient will be discharged after an extended observation. He will return to Vascular Interventional Radiology (VIR) for follow-up in 3 months with a contrasted, multiphasic CT abdomen. Thom Hall, MD [facility] Radiology Electronically Signed   By: Thom Hall M.D.   On: 08/06/2023 17:15   CT RENAL BIOPSY Result Date: 08/06/2023 CLINICAL DATA:  RT RENAL CRYO AND BX 58 y.o. male with history of tobacco abuse, and a RIGHT renal mass. EXAM: CT GUIDED ABLATION; CT BIOPSY CORE RENAL Procedures: 1. CT-GUIDED CORE BIOPSY OF RIGHT RENAL MASS 2. CT-GUIDED PERCUTANEOUS CRYOABLATION OF RIGHT RENAL MASS ANESTHESIA/SEDATION: Sedation by the Anesthesia Team was performed. Please see anesthesiology log for details. MEDICATIONS: Rocephin  2 gm IV .The antibiotic was administered in an appropriate time interval prior to needle puncture of the skin. CONTRAST:  100mL OMNIPAQUE  IOHEXOL  300 MG/ML  SOLN PROCEDURE: RADIATION DOSE REDUCTION: This exam was performed according  to the departmental dose-optimization program which includes automated exposure control, adjustment of the mA and/or kV according to patient size and/or use of iterative reconstruction technique. The procedure, risks, benefits, and alternatives were explained to the patient and/or patient's representative. Questions regarding the procedure  were encouraged and answered. The patient understands and consents to the procedure. The patient was placed under general anesthesia. Initial un-enhanced CT was performed in a prone position to localize the superior pole cortical RIGHT renal mass. The patient's RIGHT flank was prepped with chlorhexidine  in a sterile fashion, and a sterile drape was applied covering the operative field. A sterile gown and sterile gloves were used for the procedure. Under CT guidance, 3x IceForce 2.1 CX percutaneous cryoablation probe was advanced into the RIGHT renal mass. Additionally, 1 hydrodissection needle was placed, to adequately separate the mass away from the diaphragm and RIGHT posterior chest wall. Probe positioning was confirmed by CT prior to cryoablation. A 17 gauge trocar needle was advanced into the RIGHT renal mass. Core biopsy was performed with an 18 gauge automated core biopsy device. A total of 3 core samples were submitted in formalin for pathologic analysis. Cryoablation was performed using a standard protocol. An initial 10 minute cycle of cryoablation was performed. This was followed by a 8 minute thaw cycle. A second 10 minute cycle of cryoablation was then performed. During ablation, periodic CT imaging was performed to monitor ice ball formation and morphology. After active thaw, the cryoablation probes were removed. A contrasted post-procedural CT was performed. A dressing was placed. The patient tolerated the procedure well without immediate post procedural complication COMPLICATIONS: None immediate. FINDINGS: 3.8 cm RIGHT posterosuperior solid renal cortical mass. Technically successful CT guided biopsy and cryoablation with adequate lesional coverage by the ice ball. IMPRESSION: Successful CT-guided percutaneous core biopsy and cryoablation of the RIGHT renal mass. PLAN: The patient will be discharged after an extended observation. He will return to Vascular Interventional Radiology (VIR) for follow-up  in 3 months with a contrasted, multiphasic CT abdomen. Thom Hall, MD [facility] Radiology Electronically Signed   By: Thom Hall M.D.   On: 08/06/2023 17:15   DG Chest 1 View Result Date: 08/01/2023 CLINICAL DATA:  58 year old male preoperative chest x-ray EXAM: CHEST  1 VIEW COMPARISON:  04/27/2023 FINDINGS: Cardiomediastinal silhouette unchanged in size and contour. No evidence of central vascular congestion. No interlobular septal thickening. Interval resolution of the opacity associated with the minor fissure. No pneumothorax or pleural effusion. Coarsened interstitial markings, with no confluent airspace disease. No acute displaced fracture. Degenerative changes of the spine. IMPRESSION: Negative for acute cardiopulmonary disease Electronically Signed   By: Ami Bellman D.O.   On: 08/01/2023 16:25

## 2023-11-24 ENCOUNTER — Other Ambulatory Visit: Payer: Self-pay

## 2023-11-24 DIAGNOSIS — C641 Malignant neoplasm of right kidney, except renal pelvis: Secondary | ICD-10-CM

## 2023-11-25 ENCOUNTER — Encounter (HOSPITAL_COMMUNITY): Payer: Self-pay | Admitting: Radiology

## 2023-11-25 ENCOUNTER — Ambulatory Visit (HOSPITAL_COMMUNITY)
Admission: RE | Admit: 2023-11-25 | Discharge: 2023-11-25 | Disposition: A | Discharge status: Home / Self Care | Payer: Self-pay | Source: Ambulatory Visit | Attending: Hematology | Admitting: Hematology

## 2023-11-25 ENCOUNTER — Inpatient Hospital Stay: Payer: Self-pay | Attending: Hematology

## 2023-11-25 DIAGNOSIS — C641 Malignant neoplasm of right kidney, except renal pelvis: Secondary | ICD-10-CM | POA: Insufficient documentation

## 2023-11-25 DIAGNOSIS — F17210 Nicotine dependence, cigarettes, uncomplicated: Secondary | ICD-10-CM | POA: Insufficient documentation

## 2023-11-25 DIAGNOSIS — Z806 Family history of leukemia: Secondary | ICD-10-CM | POA: Insufficient documentation

## 2023-11-25 LAB — CBC WITH DIFFERENTIAL/PLATELET
Abs Immature Granulocytes: 0.02 10*3/uL (ref 0.00–0.07)
Basophils Absolute: 0.1 10*3/uL (ref 0.0–0.1)
Basophils Relative: 1 %
Eosinophils Absolute: 0.1 10*3/uL (ref 0.0–0.5)
Eosinophils Relative: 2 %
HCT: 49.7 % (ref 39.0–52.0)
Hemoglobin: 16.9 g/dL (ref 13.0–17.0)
Immature Granulocytes: 0 %
Lymphocytes Relative: 25 %
Lymphs Abs: 2.4 10*3/uL (ref 0.7–4.0)
MCH: 31.9 pg (ref 26.0–34.0)
MCHC: 34 g/dL (ref 30.0–36.0)
MCV: 94 fL (ref 80.0–100.0)
Monocytes Absolute: 0.7 10*3/uL (ref 0.1–1.0)
Monocytes Relative: 7 %
Neutro Abs: 6.3 10*3/uL (ref 1.7–7.7)
Neutrophils Relative %: 65 %
Platelets: 196 10*3/uL (ref 150–400)
RBC: 5.29 MIL/uL (ref 4.22–5.81)
RDW: 13.1 % (ref 11.5–15.5)
WBC: 9.6 10*3/uL (ref 4.0–10.5)
nRBC: 0 % (ref 0.0–0.2)

## 2023-11-25 LAB — COMPREHENSIVE METABOLIC PANEL WITH GFR
ALT: 24 U/L (ref 0–44)
AST: 23 U/L (ref 15–41)
Albumin: 3.9 g/dL (ref 3.5–5.0)
Alkaline Phosphatase: 89 U/L (ref 38–126)
Anion gap: 7 (ref 5–15)
BUN: 17 mg/dL (ref 6–20)
CO2: 24 mmol/L (ref 22–32)
Calcium: 9.3 mg/dL (ref 8.9–10.3)
Chloride: 104 mmol/L (ref 98–111)
Creatinine, Ser: 0.82 mg/dL (ref 0.61–1.24)
GFR, Estimated: >60 mL/min (ref >60)
Glucose, Bld: 105 mg/dL — ABNORMAL HIGH (ref 70–99)
Potassium: 3.9 mmol/L (ref 3.5–5.1)
Sodium: 135 mmol/L (ref 135–145)
Total Bilirubin: 1.2 mg/dL (ref 0.0–1.2)
Total Protein: 7.1 g/dL (ref 6.5–8.1)

## 2023-11-25 MED ORDER — IOHEXOL 300 MG/ML  SOLN
100.0000 mL | Freq: Once | INTRAMUSCULAR | Status: AC | PRN
  Administered 2023-11-25: 100 mL via INTRAVENOUS

## 2023-12-03 ENCOUNTER — Inpatient Hospital Stay: Payer: Self-pay | Admitting: Hematology

## 2023-12-03 VITALS — BP 122/73 | HR 70 | Temp 97.7°F | Resp 16 | Wt 225.1 lb

## 2023-12-03 DIAGNOSIS — C641 Malignant neoplasm of right kidney, except renal pelvis: Secondary | ICD-10-CM

## 2023-12-03 NOTE — Progress Notes (Signed)
 [facility] [address], [place] [address]    Clinic Day:  12/03/2023  Referring physician: No ref. provider found  Patient Care Team: Patient, No Pcp Per as PCP - General (General Practice)   ASSESSMENT & PLAN:   Assessment: 1.  Right kidney clear-cell renal cell carcinoma: - Patient seen in the ER on 04/27/2023 with weakness and was diagnosed with pneumonia. - CT chest (04/27/2023): Incidental heterogeneously enhancing 3.5 cm mass within the visualized upper pole of the right kidney, incompletely included in the exam suspicious for primary renal neoplasm. - No hematuria/B symptoms. - MRI abdomen (05/19/2023): Heterogeneous enhancing mass arising from the medial superior pole of right kidney measuring 3.8 x 3.4 cm with no evidence of renal vein invasion.  No lymphadenopathy or metastatic disease. - 08/06/2023: Right kidney mass biopsy and percutaneous cryoablation - Pathology: Clear-cell renal cell carcinoma, grade 2   2.  Social/family history: - Lives at home with his wife.  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  Has exposure to paints.  Current active smoker, 2 pack/day, started at age 69. - Mother died of acute leukemia.  Plan: 1.  Right kidney clear-cell RCC: - He underwent cryoablation of the clear-cell renal cell carcinoma in January 2025. - He denies any new onset pains.  Denies any hematuria. - Reviewed labs from 11/25/2023: Normal creatinine and CBC. - Reviewed CT abdomen with and without contrast from 11/25/2023: Post ablation changes in the upper pole of the right kidney with no evidence of malignancy. - Recommend follow-up in 6 months with repeat CTAP with and without contrast and routine labs.    No orders of the defined types were placed in this encounter.     Andre Donovan,acting as a Neurosurgeon for Paulett Boros, MD.,have documented all relevant documentation on the behalf of Paulett Boros, MD,as directed by  Paulett Boros, MD while in the presence of Paulett Boros, MD.  I, Paulett Boros MD, have reviewed the above documentation for accuracy and completeness, and I agree with the above.     Andre Donovan   5/14/20258:22 AM  CHIEF COMPLAINT:   Diagnosis: Right kidney mass consistent with RCC   Cancer Staging  No matching staging information was found for the patient.    Prior Therapy: Cryoablation on 08/06/2023  Current Therapy: Surveillance   HISTORY OF PRESENT ILLNESS:   Oncology History   No history exists.     INTERVAL HISTORY:   Andre Donovan is a 58 y.o. male presenting to clinic today for follow up of right kidney mass. He was last seen by me on 08/26/23.  Since his last visit, he underwent CT renal abdomen on 11/25/23 that found: nterval post ablation changes in the upper pole of the right kidney with no evidence for malignancy within the abdomen.  Today, he states that he is doing well overall. His appetite level is at 75%. His energy level is at 75%.  PAST MEDICAL HISTORY:   Past Medical History: Past Medical History:  Diagnosis Date   Cancer (HCC)    kidney cancer   History of kidney stones    Pneumonia    05/2023- went to [facility]    Surgical History: Past Surgical History:  Procedure Laterality Date   IR RADIOLOGIST EVAL & MGMT  06/11/2023   RADIOLOGY WITH ANESTHESIA Right 08/06/2023   Procedure: CT WITH ANESTHESIA CRYOABLATION OF RIGHT RENAL MASS;  Surgeon: Art Largo, MD;  Location: [facility];  Service: Radiology;  Laterality:  Right;    Social History: Social History   Socioeconomic History   Marital status: Married    Spouse name: Not on file   Number of children: Not on file   Years of education: Not on file   Highest education level: Not on file  Occupational History   Not on file  Tobacco Use   Smoking status: Every Day    Current packs/day: 2.00    Types: Cigarettes   Smokeless tobacco: Never  Vaping Use   Vaping status: Never Used   Substance and Sexual Activity   Alcohol use: Never   Drug use: Not Currently    Types: Marijuana    Comment: hx of 20 years ago   Sexual activity: Not on file    Comment: rare  Other Topics Concern   Not on file  Social History Narrative   Not on file   Social Drivers of Health   Financial Resource Strain: Not on file  Food Insecurity: Not on file  Transportation Needs: Not on file  Physical Activity: Not on file  Stress: Not on file  Social Connections: Not on file  Intimate Partner Violence: Not on file    Family History: No family history on file.  Current Medications:  Current Outpatient Medications:    docusate sodium  (COLACE) 100 MG capsule, Take 1 capsule (100 mg total) by mouth daily., Disp: 7 capsule, Rfl: 0   ondansetron  (ZOFRAN ) 4 MG tablet, Take 1 tablet (4 mg total) by mouth every 8 (eight) hours as needed for nausea or vomiting., Disp: 10 tablet, Rfl: 0   ondansetron  (ZOFRAN ) 4 MG tablet, Take 1 tablet (4 mg total) by mouth every 8 (eight) hours as needed for nausea or vomiting., Disp: 10 tablet, Rfl: 0   oxyCODONE  (OXY IR/ROXICODONE ) 5 MG immediate release tablet, Take 2 tablets (10 mg total) by mouth every 6 (six) hours as needed for severe pain (pain score 7-10)., Disp: 10 tablet, Rfl: 0   Allergies: No Known Allergies  REVIEW OF SYSTEMS:   Review of Systems  Constitutional:  Negative for chills, fatigue and fever.  HENT:   Negative for lump/mass, mouth sores, nosebleeds, sore throat and trouble swallowing.   Eyes:  Negative for eye problems.  Respiratory:  Negative for cough and shortness of breath.   Cardiovascular:  Negative for chest pain, leg swelling and palpitations.  Gastrointestinal:  Negative for abdominal pain, constipation, diarrhea, nausea and vomiting.  Genitourinary:  Negative for bladder incontinence, difficulty urinating, dysuria, frequency, hematuria and nocturia.   Musculoskeletal:  Negative for arthralgias, back pain, flank pain,  myalgias and neck pain.  Skin:  Negative for itching and rash.  Neurological:  Positive for dizziness. Negative for headaches and numbness.  Hematological:  Does not bruise/bleed easily.  Psychiatric/Behavioral:  Negative for depression, sleep disturbance and suicidal ideas. The patient is not nervous/anxious.   All other systems reviewed and are negative.    VITALS:   There were no vitals taken for this visit.  Wt Readings from Last 3 Encounters:  08/26/23 225 lb 5 oz (102.2 kg)  08/06/23 224 lb 13.9 oz (102 kg)  07/28/23 227 lb (103 kg)    There is no height or weight on file to calculate BMI.  Performance status (ECOG): 1 - Symptomatic but completely ambulatory  PHYSICAL EXAM:   Physical Exam Vitals and nursing note reviewed. Exam conducted with a chaperone present.  Constitutional:      Appearance: Normal appearance.  Cardiovascular:  Rate and Rhythm: Normal rate and regular rhythm.     Pulses: Normal pulses.     Heart sounds: Normal heart sounds.  Pulmonary:     Effort: Pulmonary effort is normal.     Breath sounds: Normal breath sounds.  Abdominal:     Palpations: Abdomen is soft. There is no hepatomegaly, splenomegaly or mass.     Tenderness: There is no abdominal tenderness.  Musculoskeletal:     Right lower leg: No edema.     Left lower leg: No edema.  Lymphadenopathy:     Cervical: No cervical adenopathy.     Right cervical: No superficial, deep or posterior cervical adenopathy.    Left cervical: No superficial, deep or posterior cervical adenopathy.     Upper Body:     Right upper body: No supraclavicular or axillary adenopathy.     Left upper body: No supraclavicular or axillary adenopathy.  Neurological:     General: No focal deficit present.     Mental Status: He is alert and oriented to person, place, and time.  Psychiatric:        Mood and Affect: Mood normal.        Behavior: Behavior normal.     LABS:      Latest Ref Rng & Units 11/25/2023     8:43 AM 08/26/2023    1:48 PM 07/28/2023   11:21 AM  CBC  WBC 4.0 - 10.5 K/uL 9.6  7.9  8.0   Hemoglobin 13.0 - 17.0 g/dL 47.8  29.5  62.1   Hematocrit 39.0 - 52.0 % 49.7  47.3  51.4   Platelets 150 - 400 K/uL 196  196  174       Latest Ref Rng & Units 11/25/2023    8:43 AM 08/26/2023    1:48 PM 07/28/2023   11:21 AM  CMP  Glucose 70 - 99 mg/dL 308  657  846   BUN 6 - 20 mg/dL 17  15  12    Creatinine 0.61 - 1.24 mg/dL 9.62  9.52  8.41   Sodium 135 - 145 mmol/L 135  137  137   Potassium 3.5 - 5.1 mmol/L 3.9  3.8  4.7   Chloride 98 - 111 mmol/L 104  104  107   CO2 22 - 32 mmol/L 24  23  23    Calcium 8.9 - 10.3 mg/dL 9.3  9.1  9.5   Total Protein 6.5 - 8.1 g/dL 7.1  6.6    Total Bilirubin 0.0 - 1.2 mg/dL 1.2  0.8    Alkaline Phos 38 - 126 U/L 89  80    AST 15 - 41 U/L 23  23    ALT 0 - 44 U/L 24  22       No results found for: "CEA1", "CEA" / No results found for: "CEA1", "CEA" No results found for: "PSA1" No results found for: "LKG401" No results found for: "CAN125"  No results found for: "TOTALPROTELP", "ALBUMINELP", "A1GS", "A2GS", "BETS", "BETA2SER", "GAMS", "MSPIKE", "SPEI" No results found for: "TIBC", "FERRITIN", "IRONPCTSAT" No results found for: "LDH"   STUDIES:   CT RENAL ABD W/WO Result Date: 11/25/2023 EXAMINATION: CT RENAL ABDOMEN W WO CONTRAST CLINICAL INDICATION: Male, 58 years old. Kidney cancer, active surveillance TECHNIQUE: Axial CT of the abdomen with and without 100 cc Omnipaque  300 intravenous contrast. Multiplanar reformations provided. Unless otherwise specified, incidental thyroid, adrenal, renal lesions do not require dedicated imaging follow up. Additionally, any mentioned pulmonary nodules do not  require dedicated imaging follow-up based on the Fleischner guidelines unless otherwise specified. Coronary calcifications are not identified unless otherwise specified. COMPARISON: 05/19/2023 FINDINGS: The lung bases are clear. The heart is normal in size. Small  hepatic hemangiomas are again noted. The gallbladder is normal. The spleen is normal. The pancreas is normal. The adrenals are normal. The left kidney is normal. Postablation changes are noted in the upper pole of the right kidney with no suspicious enhancement. Right renal cysts are seen. Abdominal aorta is normal in caliber. Scattered atherosclerotic changes are present. Visualized loops of large and small bowel appear within normal limits. No free fluid or adenopathy. There are degenerative changes in the spine. IMPRESSION: Interval post ablation changes in the upper pole of the right kidney with no evidence for malignancy within the abdomen. DOSE REDUCTION: This exam was performed according to our departmental dose-optimization program which includes automated exposure control, adjustment of the mA and/or kV according to patient size and/or use of iterative reconstruction technique. Electronically signed by: Italy Engel MD 11/25/2023 02:36 PM EDT RP Workstation: [id number]

## 2023-12-03 NOTE — Patient Instructions (Addendum)
 [facility] at [facility] Discharge Instructions   You were seen and examined today by Dr. Cheree Cords.  He reviewed the results of your lab work which are normal/stable.   He reviewed the results of your CT scan which did not show any evidence of cancer.   We will see you back in 6 months. We will repeat a CT scan and lab work prior to this visit.    Return as scheduled.    Thank you for choosing [facility] at [facility] to provide your oncology and hematology care.  To afford each patient quality time with our provider, please arrive at least 15 minutes before your scheduled appointment time.   If you have a lab appointment with the Cancer Center please come in thru the Main Entrance and check in at the main information desk.  You need to re-schedule your appointment should you arrive 10 or more minutes late.  We strive to give you quality time with our providers, and arriving late affects you and other patients whose appointments are after yours.  Also, if you no show three or more times for appointments you may be dismissed from the clinic at the providers discretion.     Again, thank you for choosing [facility].  Our hope is that these requests will decrease the amount of time that you wait before being seen by our physicians.       _____________________________________________________________  Should you have questions after your visit to [facility], please contact our office at [phone] and follow the prompts.  Our office hours are 8:00 a.m. and 4:30 p.m. Monday - Friday.  Please note that voicemails left after 4:00 p.m. may not be returned until the following business day.  We are closed weekends and major holidays.  You do have access to a nurse 24-7, just call the main number to the clinic [phone] and do not press any options, hold on the line and a nurse will answer the phone.    For prescription  refill requests, have your pharmacy contact our office and allow 72 hours.    Due to Covid, you will need to wear a mask upon entering the hospital. If you do not have a mask, a mask will be given to you at the Main Entrance upon arrival. For doctor visits, patients may have 1 support person age 9 or older with them. For treatment visits, patients can not have anyone with them due to social distancing guidelines and our immunocompromised population.

## 2024-04-20 ENCOUNTER — Emergency Department (HOSPITAL_COMMUNITY)
Admission: EM | Admit: 2024-04-20 | Discharge: 2024-04-20 | Disposition: A | Discharge status: Home / Self Care | Payer: Self-pay | Attending: Emergency Medicine | Admitting: Emergency Medicine

## 2024-04-20 ENCOUNTER — Other Ambulatory Visit: Payer: Self-pay

## 2024-04-20 ENCOUNTER — Encounter (HOSPITAL_COMMUNITY): Payer: Self-pay

## 2024-04-20 ENCOUNTER — Emergency Department (HOSPITAL_COMMUNITY): Payer: Self-pay

## 2024-04-20 DIAGNOSIS — K921 Melena: Secondary | ICD-10-CM | POA: Insufficient documentation

## 2024-04-20 DIAGNOSIS — E860 Dehydration: Secondary | ICD-10-CM | POA: Insufficient documentation

## 2024-04-20 DIAGNOSIS — R42 Dizziness and giddiness: Secondary | ICD-10-CM

## 2024-04-20 LAB — COMPREHENSIVE METABOLIC PANEL WITH GFR
ALT: 31 U/L (ref 0–44)
AST: 28 U/L (ref 15–41)
Albumin: 4.3 g/dL (ref 3.5–5.0)
Alkaline Phosphatase: 108 U/L (ref 38–126)
Anion gap: 10 (ref 5–15)
BUN: 13 mg/dL (ref 6–20)
CO2: 23 mmol/L (ref 22–32)
Calcium: 9.6 mg/dL (ref 8.9–10.3)
Chloride: 104 mmol/L (ref 98–111)
Creatinine, Ser: 0.75 mg/dL (ref 0.61–1.24)
GFR, Estimated: >60 mL/min (ref >60)
Glucose, Bld: 127 mg/dL — ABNORMAL HIGH (ref 70–99)
Potassium: 4.1 mmol/L (ref 3.5–5.1)
Sodium: 137 mmol/L (ref 135–145)
Total Bilirubin: 0.8 mg/dL (ref 0.0–1.2)
Total Protein: 7.7 g/dL (ref 6.5–8.1)

## 2024-04-20 LAB — CBC WITH DIFFERENTIAL/PLATELET
Abs Immature Granulocytes: 0.05 K/uL (ref 0.00–0.07)
Basophils Absolute: 0 K/uL (ref 0.0–0.1)
Basophils Relative: 0 %
Eosinophils Absolute: 0.1 K/uL (ref 0.0–0.5)
Eosinophils Relative: 1 %
HCT: 51.1 % (ref 39.0–52.0)
Hemoglobin: 17.4 g/dL — ABNORMAL HIGH (ref 13.0–17.0)
Immature Granulocytes: 1 %
Lymphocytes Relative: 20 %
Lymphs Abs: 1.9 K/uL (ref 0.7–4.0)
MCH: 32 pg (ref 26.0–34.0)
MCHC: 34.1 g/dL (ref 30.0–36.0)
MCV: 94.1 fL (ref 80.0–100.0)
Monocytes Absolute: 0.6 K/uL (ref 0.1–1.0)
Monocytes Relative: 6 %
Neutro Abs: 6.9 K/uL (ref 1.7–7.7)
Neutrophils Relative %: 72 %
Platelets: 182 K/uL (ref 150–400)
RBC: 5.43 MIL/uL (ref 4.22–5.81)
RDW: 12.7 % (ref 11.5–15.5)
WBC: 9.5 K/uL (ref 4.0–10.5)
nRBC: 0 % (ref 0.0–0.2)

## 2024-04-20 LAB — CBG MONITORING, ED: Glucose-Capillary: 132 mg/dL — ABNORMAL HIGH (ref 70–99)

## 2024-04-20 MED ORDER — SODIUM CHLORIDE 0.9 % IV BOLUS
1000.0000 mL | Freq: Once | INTRAVENOUS | Status: AC
  Administered 2024-04-20: 1000 mL via INTRAVENOUS

## 2024-04-20 MED ORDER — ONDANSETRON 4 MG PO TBDP: ORAL_TABLET | ORAL | 0 refills | Status: DC

## 2024-04-20 MED ORDER — MECLIZINE HCL 25 MG PO TABS: 25.0000 mg | ORAL_TABLET | Freq: Three times a day (TID) | ORAL | 0 refills | Status: DC | PRN

## 2024-04-20 MED ORDER — ONDANSETRON HCL 4 MG/2ML IJ SOLN
4.0000 mg | Freq: Once | INTRAMUSCULAR | Status: AC
  Administered 2024-04-20: 4 mg via INTRAVENOUS
  Filled 2024-04-20: qty 2

## 2024-04-20 NOTE — Discharge Instructions (Signed)
 Drink plenty of fluids.  Return if any problems.  Follow-up with either your family doctor or the neurologist you have been referred to

## 2024-04-20 NOTE — ED Triage Notes (Signed)
 Pt states driving to work around [id number] and approximately 10  minutes down the road started feeling dizzy. Pt reports still has slight dizziness. Pt reports waking up fine. Pt denies headache.

## 2024-04-20 NOTE — ED Provider Notes (Signed)
  EMERGENCY DEPARTMENT AT [facility] Provider Note   CSN: [id number] Arrival date & time: 04/20/24  9166     Patient presents with: Dizziness   Andre Donovan is a 58 y.o. male.  {Add pertinent medical, surgical, social history, OB history to YEP:67052} Patient states that he today he felt dizzy like things were moving some and he was afraid he was in a pass out.   Dizziness      Prior to Admission medications   Medication Sig Start Date End Date Taking? Authorizing Provider  acetaminophen  (TYLENOL ) 325 MG tablet Take 650 mg by mouth every 6 (six) hours as needed for moderate pain (pain score 4-6).   Yes [provider]  meclizine (ANTIVERT) 25 MG tablet Take 1 tablet (25 mg total) by mouth 3 (three) times daily as needed for dizziness. 04/20/24  Yes Bonifacio Pruden, MD  ondansetron  (ZOFRAN -ODT) 4 MG disintegrating tablet 4mg  ODT q4 hours prn nausea/vomit 04/20/24  Yes Sanjuan Sawa, MD    Allergies: Patient has no known allergies.    Review of Systems  Neurological:  Positive for dizziness.    Updated Vital Signs BP 113/71   Pulse (!) 56   Temp 97.7 F (36.5 C) (Oral)   Resp 18   Ht 6' (1.829 m)   Wt 90.7 kg   SpO2 96%   BMI 27.12 kg/m   Physical Exam  (all labs ordered are listed, but only abnormal results are displayed) Labs Reviewed  CBC WITH DIFFERENTIAL/PLATELET - Abnormal; Notable for the following components:      Result Value   Hemoglobin 17.4 (*)    All other components within normal limits  COMPREHENSIVE METABOLIC PANEL WITH GFR - Abnormal; Notable for the following components:   Glucose, Bld 127 (*)    All other components within normal limits  CBG MONITORING, ED - Abnormal; Notable for the following components:   Glucose-Capillary 132 (*)    All other components within normal limits    EKG: None  Radiology: MR BRAIN WO CONTRAST Result Date: 04/20/2024 CLINICAL DATA:  Provided history: Headache, neuro  deficit. Additional history obtained from electronic MEDICAL RECORD NUMBERDizziness. EXAM: MRI HEAD WITHOUT CONTRAST TECHNIQUE: Multiplanar, multiecho pulse sequences of the brain and surrounding structures were obtained without intravenous contrast. COMPARISON:  None. FINDINGS: Brain: No age-advanced or lobar predominant cerebral atrophy. No cortical encephalomalacia is identified. No significant cerebral white matter disease for age. There is no acute infarct. No evidence of an intracranial mass. No chronic intracranial blood products. No extra-axial fluid collection. No midline shift. Vascular: Maintained flow voids within the proximal large arterial vessels. Skull and upper cervical spine: No focal worrisome marrow lesion. Sinuses/Orbits: No mass or acute finding within the imaged orbits. No significant paranasal sinus disease. IMPRESSION: Unremarkable non-contrast MRI appearance of the brain for age. No evidence of an acute intracranial abnormality. Electronically Signed   By: Rockey Childs D.O.   On: 04/20/2024 11:40    {Document cardiac monitor, telemetry assessment procedure when appropriate:32947} Procedures   Medications Ordered in the ED  ondansetron  (ZOFRAN ) injection 4 mg (4 mg Intravenous Given 04/20/24 0930)  sodium chloride  0.9 % bolus 1,000 mL (0 mLs Intravenous Stopped 04/20/24 1246)      {Click here for ABCD2, HEART and other calculators REFRESH Note before signing:1}                              Medical Decision  Making Amount and/or Complexity of Data Reviewed Labs: ordered. Radiology: ordered.  Risk Prescription drug management.   Patient with dizziness most likely related to vertigo.  He has improved with IV fluids and Zofran .  Patient is sent home on Antivert and Zofran   {Document critical care time when appropriate  Document review of labs and clinical decision tools ie CHADS2VASC2, etc  Document your independent review of radiology images and any outside records  Document  your discussion with family members, caretakers and with consultants  Document social determinants of health affecting pt's care  Document your decision making why or why not admission, treatments were needed:32947:::1}   Final diagnoses:  Dizziness  Dehydration    ED Discharge Orders          Ordered    ondansetron  (ZOFRAN -ODT) 4 MG disintegrating tablet        04/20/24 1340    meclizine (ANTIVERT) 25 MG tablet  3 times daily PRN        04/20/24 1340    Ambulatory referral to Neurology       Comments: An appointment is requested in approximately: 2 weeks   04/20/24 1340

## 2024-05-03 NOTE — Progress Notes (Signed)
 Device no. [id number] applied, instructed on use, patient will return via post in 7 days. J.Chandler RN

## 2024-06-03 ENCOUNTER — Other Ambulatory Visit: Payer: Self-pay

## 2024-06-03 DIAGNOSIS — C641 Malignant neoplasm of right kidney, except renal pelvis: Secondary | ICD-10-CM

## 2024-06-03 DIAGNOSIS — N2889 Other specified disorders of kidney and ureter: Secondary | ICD-10-CM

## 2024-06-04 ENCOUNTER — Ambulatory Visit (HOSPITAL_COMMUNITY): Payer: Self-pay | Attending: Hematology

## 2024-06-04 ENCOUNTER — Inpatient Hospital Stay: Payer: Self-pay

## 2024-06-10 ENCOUNTER — Inpatient Hospital Stay: Payer: Self-pay | Admitting: Oncology

## 2024-07-23 ENCOUNTER — Inpatient Hospital Stay: Payer: Self-pay | Attending: Physician Assistant

## 2024-07-23 ENCOUNTER — Ambulatory Visit (HOSPITAL_COMMUNITY)
Admission: RE | Admit: 2024-07-23 | Discharge: 2024-07-23 | Disposition: A | Discharge status: Home / Self Care | Payer: Self-pay | Source: Ambulatory Visit | Attending: Hematology | Admitting: Hematology

## 2024-07-23 DIAGNOSIS — F17210 Nicotine dependence, cigarettes, uncomplicated: Secondary | ICD-10-CM | POA: Insufficient documentation

## 2024-07-23 DIAGNOSIS — G629 Polyneuropathy, unspecified: Secondary | ICD-10-CM | POA: Insufficient documentation

## 2024-07-23 DIAGNOSIS — C641 Malignant neoplasm of right kidney, except renal pelvis: Secondary | ICD-10-CM | POA: Insufficient documentation

## 2024-07-23 DIAGNOSIS — Z8701 Personal history of pneumonia (recurrent): Secondary | ICD-10-CM | POA: Insufficient documentation

## 2024-07-23 DIAGNOSIS — M549 Dorsalgia, unspecified: Secondary | ICD-10-CM | POA: Insufficient documentation

## 2024-07-23 DIAGNOSIS — N2889 Other specified disorders of kidney and ureter: Secondary | ICD-10-CM

## 2024-07-23 DIAGNOSIS — C7951 Secondary malignant neoplasm of bone: Secondary | ICD-10-CM | POA: Insufficient documentation

## 2024-07-23 LAB — CBC WITH DIFFERENTIAL/PLATELET
Abs Immature Granulocytes: 0.03 K/uL (ref 0.00–0.07)
Basophils Absolute: 0.1 K/uL (ref 0.0–0.1)
Basophils Relative: 1 %
Eosinophils Absolute: 0.1 K/uL (ref 0.0–0.5)
Eosinophils Relative: 1 %
HCT: 50.6 % (ref 39.0–52.0)
Hemoglobin: 16.9 g/dL (ref 13.0–17.0)
Immature Granulocytes: 0 %
Lymphocytes Relative: 27 %
Lymphs Abs: 2.7 K/uL (ref 0.7–4.0)
MCH: 31.5 pg (ref 26.0–34.0)
MCHC: 33.4 g/dL (ref 30.0–36.0)
MCV: 94.2 fL (ref 80.0–100.0)
Monocytes Absolute: 0.7 K/uL (ref 0.1–1.0)
Monocytes Relative: 7 %
Neutro Abs: 6.2 K/uL (ref 1.7–7.7)
Neutrophils Relative %: 64 %
Platelets: 194 K/uL (ref 150–400)
RBC: 5.37 MIL/uL (ref 4.22–5.81)
RDW: 12.6 % (ref 11.5–15.5)
WBC: 9.8 K/uL (ref 4.0–10.5)
nRBC: 0 % (ref 0.0–0.2)

## 2024-07-23 LAB — COMPREHENSIVE METABOLIC PANEL WITH GFR
ALT: 22 U/L (ref 0–44)
AST: 29 U/L (ref 15–41)
Albumin: 4.3 g/dL (ref 3.5–5.0)
Alkaline Phosphatase: 121 U/L (ref 38–126)
Anion gap: 13 (ref 5–15)
BUN: 14 mg/dL (ref 6–20)
CO2: 22 mmol/L (ref 22–32)
Calcium: 9.4 mg/dL (ref 8.9–10.3)
Chloride: 103 mmol/L (ref 98–111)
Creatinine, Ser: 0.81 mg/dL (ref 0.61–1.24)
GFR, Estimated: >60 mL/min
Glucose, Bld: 87 mg/dL (ref 70–99)
Potassium: 4.1 mmol/L (ref 3.5–5.1)
Sodium: 138 mmol/L (ref 135–145)
Total Bilirubin: 0.9 mg/dL (ref 0.0–1.2)
Total Protein: 7.2 g/dL (ref 6.5–8.1)

## 2024-07-23 MED ORDER — IOHEXOL 300 MG/ML  SOLN
100.0000 mL | Freq: Once | INTRAMUSCULAR | Status: AC | PRN
  Administered 2024-07-23: 100 mL via INTRAVENOUS

## 2024-07-29 LAB — POCT I-STAT CREATININE: Creatinine, Ser: 1 mg/dL (ref 0.61–1.24)

## 2024-08-02 ENCOUNTER — Inpatient Hospital Stay: Payer: Self-pay

## 2024-08-02 ENCOUNTER — Inpatient Hospital Stay: Payer: Self-pay | Admitting: Physician Assistant

## 2024-08-02 VITALS — BP 130/72 | HR 83 | Temp 97.0°F | Resp 18 | Ht 72.0 in | Wt 213.0 lb

## 2024-08-02 DIAGNOSIS — F17210 Nicotine dependence, cigarettes, uncomplicated: Secondary | ICD-10-CM

## 2024-08-02 DIAGNOSIS — Z8701 Personal history of pneumonia (recurrent): Secondary | ICD-10-CM

## 2024-08-02 DIAGNOSIS — G629 Polyneuropathy, unspecified: Secondary | ICD-10-CM

## 2024-08-02 DIAGNOSIS — C641 Malignant neoplasm of right kidney, except renal pelvis: Secondary | ICD-10-CM

## 2024-08-02 DIAGNOSIS — C7951 Secondary malignant neoplasm of bone: Secondary | ICD-10-CM

## 2024-08-02 DIAGNOSIS — M898X9 Other specified disorders of bone, unspecified site: Secondary | ICD-10-CM

## 2024-08-02 DIAGNOSIS — M549 Dorsalgia, unspecified: Secondary | ICD-10-CM

## 2024-08-02 DIAGNOSIS — M899 Disorder of bone, unspecified: Secondary | ICD-10-CM

## 2024-08-02 LAB — PSA: Prostatic Specific Antigen: 0.58 ng/mL (ref 0.00–4.00)

## 2024-08-02 NOTE — Progress Notes (Signed)
 " [facility]   PROGRES NOTE  Patient Care Team: Patient, No Pcp Per as PCP - General (General Practice)   CHIEF COMPLAINTS/PURPOSE OF CONSULTATION:  Right clear cell renal cell carcinoma  ONCOLOGIC HISTORY: Initially presented to the ER on 04/27/2023 with weakness and was diagnosed with pneumonia. CT chest (04/27/2023): Incidental heterogeneously enhancing 3.5 cm mass within the visualized upper pole of the right kidney, incompletely included in the exam suspicious for primary renal neoplasm. No hematuria/B symptoms. MRI abdomen (05/19/2023): Heterogeneous enhancing mass arising from the medial superior pole of right kidney measuring 3.8 x 3.4 cm with no evidence of renal vein invasion.  No lymphadenopathy or metastatic disease. 08/06/2023: Right kidney mass biopsy and percutaneous cryoablation Pathology: Clear-cell renal cell carcinoma, grade 2  CURRENT TREATMENT: Surveilance  INTERVAL HISTORY: Andre Donovan 59 y.o. male returns for a surveillance visit for history of right kidney clear cell RCC.  He was last seen by Dr. Rogers on 12/03/2023.  In the interim he continues on surveillance.  On exam today, Andre Donovan reports that his head and energy are overall stable.  His weight has fluctuated over the past year between 200-225 lbs. he denies nausea, vomiting or bowel habit changes.  He denies easy bruising or overt signs of bleeding.  He has noticed a new site of back pain that is involving his mid right side below the shoulder.  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  He says at times the pain can be as high as 10 out of 10.  He currently is using a tennis ball to massage the area with some improvement.  Patient reports bilateral lower extremity numbness without interference with his balance.  He has a chronic dry cough that he attributes to smoking.  He denies fevers, chills, night sweats,  chest pain, headaches or dizziness.  He has no other complaints.  MEDICAL HISTORY:  Past Medical History:  Diagnosis Date   Cancer [facility])    kidney cancer   History of kidney stones    Pneumonia    05/2023- went to [facility]    SURGICAL HISTORY: Past Surgical History:  Procedure Laterality Date   IR RADIOLOGIST EVAL & MGMT  06/11/2023   RADIOLOGY WITH ANESTHESIA Right 08/06/2023   Procedure: CT WITH ANESTHESIA CRYOABLATION OF RIGHT RENAL MASS;  Surgeon: Hughes Simmonds, MD;  Location: [facility];  Service: Radiology;  Laterality: Right;    SOCIAL HISTORY: Social History   Socioeconomic History   Marital status: Married    Spouse name: Not on file   Number of children: Not on file   Years of education: Not on file   Highest education level: Not on file  Occupational History   Not on file  Tobacco Use   Smoking status: Every Day    Current packs/day: 2.00    Types: Cigarettes   Smokeless tobacco: Never  Vaping Use   Vaping status: Never Used  Substance and Sexual Activity   Alcohol use: Never   Drug use: Not Currently    Types: Marijuana    Comment: hx of 20 years ago   Sexual activity: Not on file    Comment: rare  Other Topics Concern   Not on file  Social History Narrative   Not on file   Social Drivers of Health   Tobacco Use: High Risk (04/20/2024)   Patient History    Smoking Tobacco Use: Every  Day    Smokeless Tobacco Use: Never    Passive Exposure: Not on file  Financial Resource Strain: Not on file  Food Insecurity: Not on file  Transportation Needs: Not on file  Physical Activity: Not on file  Stress: Not on file  Social Connections: Not on file  Intimate Partner Violence: Not on file  Depression (PHQ2-9): Low Risk (08/02/2024)   Depression (PHQ2-9)    PHQ-2 Score: 0  Alcohol Screen: Not on file  Housing: Not on file  Utilities: Not on file  Health Literacy: Not on file    FAMILY HISTORY: No family history on file.  ALLERGIES:  has no known  allergies.  MEDICATIONS:  Current Outpatient Medications  Medication Sig Dispense Refill   acetaminophen  (TYLENOL ) 325 MG tablet Take 650 mg by mouth every 6 (six) hours as needed for moderate pain (pain score 4-6).     meclizine  (ANTIVERT ) 25 MG tablet Take 1 tablet (25 mg total) by mouth 3 (three) times daily as needed for dizziness. 30 tablet 0   ondansetron  (ZOFRAN -ODT) 4 MG disintegrating tablet 4mg  ODT q4 hours prn nausea/vomit 10 tablet 0   No current facility-administered medications for this visit.    REVIEW OF SYSTEMS:   Constitutional: ( - ) fevers, ( - )  chills , ( - ) night sweats Eyes: ( - ) blurriness of vision, ( - ) double vision, ( - ) watery eyes Ears, nose, mouth, throat, and face: ( - ) mucositis, ( - ) sore throat Respiratory: ( - ) cough, ( - ) dyspnea, ( - ) wheezes Cardiovascular: ( - ) palpitation, ( - ) chest discomfort, ( - ) lower extremity swelling Gastrointestinal:  ( - ) nausea, ( - ) heartburn, ( - ) change in bowel habits Skin: ( - ) abnormal skin rashes Lymphatics: ( - ) new lymphadenopathy, ( - ) easy bruising Neurological: ( - ) numbness, ( - ) tingling, ( - ) new weaknesses Behavioral/Psych: ( - ) mood change, ( - ) new changes  All other systems were reviewed with the patient and are negative.  PHYSICAL EXAMINATION: ECOG PERFORMANCE STATUS: 1 - Symptomatic but completely ambulatory  Vitals:   08/02/24 1152  BP: 130/72  Pulse: 83  Resp: 18  Temp: (!) 97 F (36.1 C)  SpO2: 100%   Filed Weights   08/02/24 1152  Weight: 213 lb (96.6 kg)    GENERAL: well appearing male in NAD  SKIN: skin color, texture, turgor are normal, no rashes or significant lesions EYES: conjunctiva are pink and non-injected, sclera clear LUNGS: clear to auscultation and percussion with normal breathing effort HEART: regular rate & rhythm and no murmurs and no lower extremity edema Musculoskeletal: no cyanosis of digits and no clubbing.  No point tenderness down  the spine.  PSYCH: alert & oriented x 3, fluent speech NEURO: no focal motor/sensory deficits  LABORATORY DATA:  I have reviewed the data as listed    Latest Ref Rng & Units 07/23/2024   12:37 PM 04/20/2024    8:55 AM 11/25/2023    8:43 AM  CBC  WBC 4.0 - 10.5 K/uL 9.8  9.5  9.6   Hemoglobin 13.0 - 17.0 g/dL 83.0  82.5  83.0   Hematocrit 39.0 - 52.0 % 50.6  51.1  49.7   Platelets 150 - 400 K/uL 194  182  196        Latest Ref Rng & Units 07/23/2024    1:28 PM 07/23/2024  12:37 PM 04/20/2024    8:55 AM  CMP  Glucose 70 - 99 mg/dL  87  872   BUN 6 - 20 mg/dL  14  13   Creatinine 9.38 - 1.24 mg/dL 8.99  9.18  9.24   Sodium 135 - 145 mmol/L  138  137   Potassium 3.5 - 5.1 mmol/L  4.1  4.1   Chloride 98 - 111 mmol/L  103  104   CO2 22 - 32 mmol/L  22  23   Calcium 8.9 - 10.3 mg/dL  9.4  9.6   Total Protein 6.5 - 8.1 g/dL  7.2  7.7   Total Bilirubin 0.0 - 1.2 mg/dL  0.9  0.8   Alkaline Phos 38 - 126 U/L  121  108   AST 15 - 41 U/L  29  28   ALT 0 - 44 U/L  22  31    RADIOGRAPHIC STUDIES: I have personally reviewed the radiological images as listed and agreed with the findings in the report. CT RENAL ABD W/WO Result Date: 07/30/2024 EXAM: CT ABDOMEN WITH AND WITHOUT CONTRAST 07/23/2024 01:40:01 PM TECHNIQUE: CT of the abdomen was performed with and without the administration of 100 mL of iohexol  (OMNIPAQUE ) 300 MG/ML solution. Multiplanar reformatted images are provided for review. Automated exposure control, iterative reconstruction, and/or weight based adjustment of the mA/kV was utilized to reduce the radiation dose to as low as reasonably achievable. COMPARISON: 11/25/2023 CLINICAL HISTORY: Kidney cancer, active surveillance. FINDINGS: LOWER CHEST: Right middle lobe atelectasis. HEPATOBILIARY: 12 mm probable hemangioma in the left hepatic lobe (series 16/image 27) unchanged, benign. Gallbladder is unremarkable. SPLEEN: Spleen demonstrates no acute abnormality. PANCREAS: Pancreas  demonstrates no acute abnormality. ADRENAL GLANDS: Adrenal glands demonstrate no acute abnormality. KIDNEYS: Status post right upper pole renal partial ablation. Ablation bed measures 4.3 cm, previously 5.4 cm. Additional scattered small simple cysts bilaterally, benign (Bosniak type 1). Per consensus, no follow-up is needed for simple Bosniak type 1 and 2 renal cysts, unless the patient has a malignancy history or risk factors. No stones in the kidneys or proximal ureters. No hydronephrosis. No perinephric or periureteral stranding. GI AND BOWEL: Stomach and duodenal sweep demonstrate no acute abnormality. There is no bowel obstruction. No abnormal bowel wall thickening or distension. PERITONEUM AND RETROPERITONEUM: No ascites or free air. Atherosclerotic calcifications of the aorta, although patent. LYMPH NODES: No lymphadenopathy. BONES AND SOFT TISSUES: 4.9 cm lytic lesion in the left iliac bone near the sacroiliac joint (series 2/image 136), new from prior MRI, suspicious for lytic metastasis. Additional new lytic lesions at L3 along the right lamina (series 9/image 90) and left posterior elements (series 9/image 84). No focal soft tissue abnormality. IMPRESSION: 1. Status post right renal cryoablation with slight contraction of the ablation zone. 2. New lytic metastases at L3 and the left iliac bone. Electronically signed by: Pinkie Pebbles MD MD 07/30/2024 08:20 PM EST RP Workstation: [id number]    ASSESSMENT & PLAN Andre Donovan is a 59 y.o. male who presents to the clinic for continued management of right kidney clear cell RCC.   #Right Kidney Clear-Cell Renal Cell Carcinoma: --He underwent cryoablation of the clear-cell renal cell carcinoma in January 2025.  --Reviewed CT scan from 07/23/2024 that showed new lytic metastases at L3 and left iliac bone.  --Will check SPEP/IFE, serum free light chains and PSA level --will order PET to further evaluate findings on CT scan and also determine target  lesion for biopsy. --RTC once biopsy  is confirmed to discuss results with Dr. Davonna.   #Right sided mid back pain: --Plan to further evaluate with upcoming PET scan.  #Bilateral lower extremity neuropathy: -- Suspect this is nerve impingement secondary to the new bone lesions involving his lumbar spine. -- No evidence of saddle paresthesia, bowel/urine incontinence.   Orders Placed This Encounter  Procedures   NM PET Image Initial (PI) Skull Base To Thigh    Standing Status:   Future    Expected Date:   08/09/2024    Expiration Date:   08/02/2025    If indicated for the ordered procedure, I authorize the administration of a radiopharmaceutical per Radiology protocol:   Yes    Preferred imaging location?:   [facility]    Release to patient:   Immediate   Kappa/lambda light chains    Standing Status:   Future    Number of Occurrences:   1    Expected Date:   08/02/2024    Expiration Date:   10/31/2024   Immunofixation electrophoresis    Standing Status:   Future    Number of Occurrences:   1    Expected Date:   08/02/2024    Expiration Date:   10/31/2024   Protein electrophoresis, serum    Standing Status:   Future    Number of Occurrences:   1    Expected Date:   08/02/2024    Expiration Date:   10/31/2024   PSA    Standing Status:   Future    Number of Occurrences:   1    Expected Date:   08/02/2024    Expiration Date:   10/31/2024    All questions were answered. The patient knows to call the clinic with any problems, questions or concerns.  I have spent a total of 30 minutes minutes of face-to-face and non-face-to-face time, preparing to see the patient, performing a medically appropriate examination, counseling and educating the patient, ordering tests/procedures,documenting clinical information in the electronic health record, independently interpreting results and communicating results to the patient, and care coordination.   Johnston Police, PA-C Department of  Hematology/Oncology [facility] "

## 2024-08-03 LAB — KAPPA/LAMBDA LIGHT CHAINS
Kappa free light chain: 27 mg/L — ABNORMAL HIGH (ref 3.3–19.4)
Kappa, lambda light chain ratio: 1.46 (ref 0.26–1.65)
Lambda free light chains: 18.5 mg/L (ref 5.7–26.3)

## 2024-08-04 LAB — PROTEIN ELECTROPHORESIS, SERUM
A/G Ratio: 1.1 (ref 0.7–1.7)
Albumin ELP: 3.7 g/dL (ref 2.9–4.4)
Alpha-1-Globulin: 0.3 g/dL (ref 0.0–0.4)
Alpha-2-Globulin: 0.8 g/dL (ref 0.4–1.0)
Beta Globulin: 1 g/dL (ref 0.7–1.3)
Gamma Globulin: 1.2 g/dL (ref 0.4–1.8)
Globulin, Total: 3.4 g/dL (ref 2.2–3.9)
Total Protein ELP: 7.1 g/dL (ref 6.0–8.5)

## 2024-08-06 LAB — IMMUNOFIXATION ELECTROPHORESIS
IgA: 128 mg/dL (ref 90–386)
IgG (Immunoglobin G), Serum: 1105 mg/dL (ref 603–1613)
IgM (Immunoglobulin M), Srm: 213 mg/dL — ABNORMAL HIGH (ref 20–172)
Total Protein ELP: 7.3 g/dL (ref 6.0–8.5)

## 2024-08-12 ENCOUNTER — Ambulatory Visit (HOSPITAL_COMMUNITY)
Admission: RE | Admit: 2024-08-12 | Discharge: 2024-08-12 | Disposition: A | Discharge status: Home / Self Care | Payer: Self-pay | Source: Ambulatory Visit | Attending: Physician Assistant | Admitting: Physician Assistant

## 2024-08-12 DIAGNOSIS — C641 Malignant neoplasm of right kidney, except renal pelvis: Secondary | ICD-10-CM | POA: Insufficient documentation

## 2024-08-12 DIAGNOSIS — M899 Disorder of bone, unspecified: Secondary | ICD-10-CM | POA: Insufficient documentation

## 2024-08-12 MED ORDER — FLUDEOXYGLUCOSE F - 18 (FDG) INJECTION
10.9000 | Freq: Once | INTRAVENOUS | Status: AC | PRN
  Administered 2024-08-12: 10.9 via INTRAVENOUS

## 2024-08-16 ENCOUNTER — Ambulatory Visit
Admit: 2024-08-16 | Discharge: 2024-08-16 | Disposition: A | Discharge status: Home / Self Care | Payer: Self-pay | Attending: Radiation Oncology | Admitting: Radiation Oncology

## 2024-08-16 ENCOUNTER — Telehealth: Payer: Self-pay | Admitting: Physician Assistant

## 2024-08-16 ENCOUNTER — Inpatient Hospital Stay (HOSPITAL_COMMUNITY): Payer: Self-pay

## 2024-08-16 ENCOUNTER — Inpatient Hospital Stay (HOSPITAL_COMMUNITY)
Admission: EM | Admit: 2024-08-16 | Discharge: 2024-08-23 | DRG: 519 | Disposition: A | Discharge status: Home / Self Care | Payer: Self-pay | Attending: Internal Medicine | Admitting: Internal Medicine

## 2024-08-16 ENCOUNTER — Other Ambulatory Visit: Payer: Self-pay

## 2024-08-16 ENCOUNTER — Emergency Department (HOSPITAL_COMMUNITY): Payer: Self-pay

## 2024-08-16 ENCOUNTER — Encounter (HOSPITAL_COMMUNITY): Payer: Self-pay

## 2024-08-16 DIAGNOSIS — M532X4 Spinal instabilities, thoracic region: Secondary | ICD-10-CM | POA: Diagnosis present

## 2024-08-16 DIAGNOSIS — G9520 Unspecified cord compression: Principal | ICD-10-CM | POA: Diagnosis present

## 2024-08-16 DIAGNOSIS — Z87442 Personal history of urinary calculi: Secondary | ICD-10-CM

## 2024-08-16 DIAGNOSIS — F17210 Nicotine dependence, cigarettes, uncomplicated: Secondary | ICD-10-CM | POA: Diagnosis present

## 2024-08-16 DIAGNOSIS — C641 Malignant neoplasm of right kidney, except renal pelvis: Secondary | ICD-10-CM | POA: Diagnosis present

## 2024-08-16 DIAGNOSIS — Z79899 Other long term (current) drug therapy: Secondary | ICD-10-CM

## 2024-08-16 DIAGNOSIS — D72828 Other elevated white blood cell count: Secondary | ICD-10-CM | POA: Diagnosis not present

## 2024-08-16 DIAGNOSIS — T380X5A Adverse effect of glucocorticoids and synthetic analogues, initial encounter: Secondary | ICD-10-CM | POA: Diagnosis not present

## 2024-08-16 DIAGNOSIS — G9589 Other specified diseases of spinal cord: Principal | ICD-10-CM

## 2024-08-16 DIAGNOSIS — Z515 Encounter for palliative care: Secondary | ICD-10-CM

## 2024-08-16 DIAGNOSIS — K118 Other diseases of salivary glands: Secondary | ICD-10-CM

## 2024-08-16 DIAGNOSIS — G893 Neoplasm related pain (acute) (chronic): Secondary | ICD-10-CM | POA: Diagnosis present

## 2024-08-16 DIAGNOSIS — C649 Malignant neoplasm of unspecified kidney, except renal pelvis: Secondary | ICD-10-CM | POA: Diagnosis present

## 2024-08-16 DIAGNOSIS — G992 Myelopathy in diseases classified elsewhere: Secondary | ICD-10-CM | POA: Diagnosis present

## 2024-08-16 DIAGNOSIS — Z7952 Long term (current) use of systemic steroids: Secondary | ICD-10-CM

## 2024-08-16 DIAGNOSIS — K5900 Constipation, unspecified: Secondary | ICD-10-CM | POA: Diagnosis present

## 2024-08-16 DIAGNOSIS — M4804 Spinal stenosis, thoracic region: Secondary | ICD-10-CM | POA: Diagnosis present

## 2024-08-16 DIAGNOSIS — Z8701 Personal history of pneumonia (recurrent): Secondary | ICD-10-CM

## 2024-08-16 DIAGNOSIS — M8448XA Pathological fracture, other site, initial encounter for fracture: Secondary | ICD-10-CM | POA: Diagnosis present

## 2024-08-16 DIAGNOSIS — C7951 Secondary malignant neoplasm of bone: Principal | ICD-10-CM | POA: Diagnosis present

## 2024-08-16 DIAGNOSIS — M899 Disorder of bone, unspecified: Secondary | ICD-10-CM

## 2024-08-16 LAB — BASIC METABOLIC PANEL WITH GFR
Anion gap: 14 (ref 5–15)
BUN: 17 mg/dL (ref 6–20)
CO2: 22 mmol/L (ref 22–32)
Calcium: 9.4 mg/dL (ref 8.9–10.3)
Chloride: 104 mmol/L (ref 98–111)
Creatinine, Ser: 0.82 mg/dL (ref 0.61–1.24)
GFR, Estimated: >60 mL/min
Glucose, Bld: 90 mg/dL (ref 70–99)
Potassium: 4 mmol/L (ref 3.5–5.1)
Sodium: 140 mmol/L (ref 135–145)

## 2024-08-16 LAB — CBC WITH DIFFERENTIAL/PLATELET
Abs Immature Granulocytes: 0.03 10*3/uL (ref 0.00–0.07)
Basophils Absolute: 0 10*3/uL (ref 0.0–0.1)
Basophils Relative: 0 %
Eosinophils Absolute: 0.1 10*3/uL (ref 0.0–0.5)
Eosinophils Relative: 1 %
HCT: 48.1 % (ref 39.0–52.0)
Hemoglobin: 16.3 g/dL (ref 13.0–17.0)
Immature Granulocytes: 0 %
Lymphocytes Relative: 17 %
Lymphs Abs: 1.8 10*3/uL (ref 0.7–4.0)
MCH: 31.6 pg (ref 26.0–34.0)
MCHC: 33.9 g/dL (ref 30.0–36.0)
MCV: 93.2 fL (ref 80.0–100.0)
Monocytes Absolute: 0.6 10*3/uL (ref 0.1–1.0)
Monocytes Relative: 6 %
Neutro Abs: 8.3 10*3/uL — ABNORMAL HIGH (ref 1.7–7.7)
Neutrophils Relative %: 76 %
Platelets: 186 10*3/uL (ref 150–400)
RBC: 5.16 MIL/uL (ref 4.22–5.81)
RDW: 12.8 % (ref 11.5–15.5)
WBC: 10.9 10*3/uL — ABNORMAL HIGH (ref 4.0–10.5)
nRBC: 0 % (ref 0.0–0.2)

## 2024-08-16 MED ORDER — DEXAMETHASONE SODIUM PHOSPHATE 4 MG/ML IJ SOLN: 4.0000 mg | Freq: Two times a day (BID) | INTRAMUSCULAR | Status: DC

## 2024-08-16 MED ORDER — DEXAMETHASONE SOD PHOSPHATE PF 10 MG/ML IJ SOLN
10.0000 mg | Freq: Once | INTRAMUSCULAR | Status: AC
  Administered 2024-08-16: 10 mg via INTRAVENOUS
  Filled 2024-08-16: qty 1

## 2024-08-16 MED ORDER — ONDANSETRON HCL 4 MG/2ML IJ SOLN: 4.0000 mg | Freq: Four times a day (QID) | INTRAMUSCULAR | Status: DC | PRN

## 2024-08-16 MED ORDER — DOCUSATE SODIUM 100 MG PO CAPS
100.0000 mg | ORAL_CAPSULE | Freq: Two times a day (BID) | ORAL | Status: DC
  Administered 2024-08-16 – 2024-08-23 (×11): 100 mg via ORAL
  Filled 2024-08-16 (×12): qty 1

## 2024-08-16 MED ORDER — DEXAMETHASONE SOD PHOSPHATE PF 10 MG/ML IJ SOLN
6.0000 mg | Freq: Four times a day (QID) | INTRAMUSCULAR | Status: DC
  Administered 2024-08-16 – 2024-08-20 (×15): 6 mg via INTRAVENOUS
  Filled 2024-08-16 (×15): qty 1

## 2024-08-16 MED ORDER — MORPHINE SULFATE (PF) 2 MG/ML IV SOLN
2.0000 mg | INTRAVENOUS | Status: DC | PRN
  Administered 2024-08-19: 2 mg via INTRAVENOUS
  Filled 2024-08-16: qty 1

## 2024-08-16 MED ORDER — ENOXAPARIN SODIUM 40 MG/0.4ML IJ SOSY
40.0000 mg | PREFILLED_SYRINGE | INTRAMUSCULAR | Status: DC
  Administered 2024-08-16: 40 mg via SUBCUTANEOUS
  Filled 2024-08-16: qty 0.4

## 2024-08-16 MED ORDER — POLYETHYLENE GLYCOL 3350 17 G PO PACK
17.0000 g | PACK | Freq: Every day | ORAL | Status: DC
  Administered 2024-08-16 – 2024-08-20 (×3): 17 g via ORAL
  Filled 2024-08-16 (×3): qty 1

## 2024-08-16 MED ORDER — OXYCODONE HCL 5 MG PO TABS: 5.0000 mg | ORAL_TABLET | ORAL | Status: DC | PRN

## 2024-08-16 MED ORDER — PANTOPRAZOLE SODIUM 40 MG IV SOLR
40.0000 mg | INTRAVENOUS | Status: DC
  Administered 2024-08-17: 40 mg via INTRAVENOUS
  Filled 2024-08-16: qty 10

## 2024-08-16 MED ORDER — IOHEXOL 300 MG/ML  SOLN: 75.0000 mL | Freq: Once | INTRAMUSCULAR | Status: DC | PRN

## 2024-08-16 MED ORDER — HYDRALAZINE HCL 20 MG/ML IJ SOLN: 10.0000 mg | Freq: Four times a day (QID) | INTRAMUSCULAR | Status: DC | PRN

## 2024-08-16 MED ORDER — ACETAMINOPHEN 325 MG PO TABS
650.0000 mg | ORAL_TABLET | Freq: Four times a day (QID) | ORAL | Status: DC | PRN
  Administered 2024-08-20: 650 mg via ORAL
  Filled 2024-08-16: qty 2

## 2024-08-16 MED ORDER — GADOBUTROL 1 MMOL/ML IV SOLN
9.0000 mL | Freq: Once | INTRAVENOUS | Status: AC | PRN
  Administered 2024-08-16: 9 mL via INTRAVENOUS

## 2024-08-16 MED ORDER — PANTOPRAZOLE SODIUM 40 MG IV SOLR
40.0000 mg | Freq: Once | INTRAVENOUS | Status: AC
  Administered 2024-08-16: 40 mg via INTRAVENOUS
  Filled 2024-08-16: qty 10

## 2024-08-16 NOTE — Telephone Encounter (Signed)
 I called and spoke to Mr. Salamone to review the PET scan results. There is concern for multifocal osseous lesions concerning for metastatic disease. In addition, there is a T5 vertebral body lesion with intraspinal extension that puts patient at risk for cord compression. Incidental finding of hypermetabolic left parotid nodule as well.   Patient and wife reports his bilateral leg neuropathy has worsened now with weakness which interferes with his ambulation. After discussion with Dr. Davonna, we advised ER evaluation with MRI thoracic spine to rule out cord compression. Patient will be coming to  ER to be assessed.   Pending testing after patient is assessed in the ER includes a STAT CT biopsy of one of the bone lesions and referral to ENT to evaluate the parotid nodule.  Dr. Davonna will see patient in clinic once biopsy is scheduled.

## 2024-08-16 NOTE — H&P (Signed)
 " History and Physical    Patient: Andre Donovan FMW:[id number] DOB: [birth year] DOA: 08/16/2024 DOS: the patient was seen and examined on 08/16/2024 PCP: Orpha Yancey LABOR, MD  Patient coming from: Home  Chief Complaint: Lower extremity paresthesia Chief Complaint  Patient presents with   Back Pain   HPI: Andre Donovan is a 59 y.o. male with medical history significant of right renal cell carcinoma which was diagnosed in October 2024 after patient had presented to the hospital with pneumonia and CT scan of the chest accidentally showed findings of right kidney mass and subsequently underwent MRI of the abdomen that showed findings of right renal mass.  On 08/06/2023 patient underwent right kidney mass biopsy as well as percutaneous cryoablation therapy and patient's pathology results showed clear-cell renal cell carcinoma grade 2.  He has since been closely monitored by hematology oncology with surveillance.  According to patient he has been feeling otherwise well until right around the time of Christmas when he started experiencing numbness as well as tingling sensation in the bilateral leg and feet starting from his abdomen downwards.  He describes it as  feeling as though I was walking on cushions.  He has also been having back pain for which he has been taking some street pain medications for pain relief.  Patient followed up with his oncologist on 08/03/2023 and oncologist subsequently ordered for PET scan.  Today patient was called by his oncologist office to review his PET scan results with him with findings of multifocal osseous lesion concerning for metastatic disease and findings concerning for eminent cord compression and therefore told to go to the [facility] emergency room for further management. At the time patient was being seen he denied weakness involving the lower extremity, abdominal pain, chest pain, cough, nausea vomiting, urinary or fecal incontinence. Patient admits to  constipation for the last 4 days however has been able to pass urine with no issues.  ED course: Upon arrival to the emergency room patient had temperature 97.9, respiratory rate 18, pulse 88, blood pressure 125/77 saturating 95 percent on room air.  MRI of the thoracic spine with and without contrast showed IMPRESSION: 1. 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 This finding was communicated to Dr. Ozell Arts at 4:20 PM. 2. Severe right T5-T6 neural foraminal narrowing. 3. Additional multifocal osseous metastatic disease throughout the spine, including at C6, T2, T6, T7, T8, and T10.  ED physician subsequently discussed the case with neurosurgeon Dr. Cabbell as well as oncologist Dr. Maritza who recommended patient be transferred from [facility] emergency room and admitted at [facility] as patient will need operative decompression and adjuvant radiation to the spine.  Hospitalist service was therefore contacted to admit patient for further management.  Review of Systems: As mentioned in the history of present illness. All other systems reviewed and are negative. Past Medical History:  Diagnosis Date   Cancer [facility])    kidney cancer   History of kidney stones    Pneumonia    05/2023- went to [facility]   Past Surgical History:  Procedure Laterality Date   IR RADIOLOGIST EVAL & MGMT  06/11/2023   RADIOLOGY WITH ANESTHESIA Right 08/06/2023   Procedure: CT WITH ANESTHESIA CRYOABLATION OF RIGHT RENAL MASS;  Surgeon: Hughes Simmonds, MD;  Location: [facility];  Service: Radiology;  Laterality: Right;   Social History:  reports that he has been smoking cigarettes. He  has never used smokeless tobacco. He reports that he does not currently use drugs after having used the following drugs: Marijuana. He reports that he does not drink alcohol.  Allergies[1]  History reviewed. No pertinent family  history.  Prior to Admission medications  Medication Sig Start Date End Date Taking? Authorizing Provider  acetaminophen  (TYLENOL ) 325 MG tablet Take 650 mg by mouth every 6 (six) hours as needed for moderate pain (pain score 4-6).    [provider]  meclizine  (ANTIVERT ) 25 MG tablet Take 1 tablet (25 mg total) by mouth 3 (three) times daily as needed for dizziness. 04/20/24   Suzette Pac, MD  ondansetron  (ZOFRAN -ODT) 4 MG disintegrating tablet 4mg  ODT q4 hours prn nausea/vomit 04/20/24   Suzette Pac, MD    Physical Exam:  General: Middle-age male laying in bed in no acute distress Respiratory: Clear to auscultation bilaterally CVS: S1, S2 present no murmurs heard Abdomen: Abdomen nontender, no palpable organomegaly CNS: Alert and oriented x 3 moving all extremities with no motor deficits.  Decreased sensation observed from the mid abdominal wall all the way to the feet bilaterally. Psychiatry: Normal mood Musculoskeletal: No lower extremity weakness  Data Reviewed: MRI of the thoracic spine showed the following findings 1. 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 2. Severe right T5-T6 neural foraminal narrowing. 3. Additional multifocal osseous metastatic disease throughout the spine, including at C6, T2, T6, T7, T8, and T10.  Vitals:   08/16/24 1234 08/16/24 1235 08/16/24 1525  BP: 125/77  117/74  Pulse: 88  77  Resp: 18  18  Temp: 97.9 F (36.6 C)    SpO2: 95%  95%  Weight:  96.6 kg       Latest Ref Rng & Units 08/16/2024    1:16 PM 07/23/2024   12:37 PM 04/20/2024    8:55 AM  CBC  WBC 4.0 - 10.5 K/uL 10.9  9.8  9.5   Hemoglobin 13.0 - 17.0 g/dL 83.6  83.0  82.5   Hematocrit 39.0 - 52.0 % 48.1  50.6  51.1   Platelets 150 - 400 K/uL 186  194  182        Latest Ref Rng & Units 08/16/2024    1:16 PM 07/23/2024    1:28 PM 07/23/2024   12:37 PM  BMP  Glucose 70 -  99 mg/dL 90   87   BUN 6 - 20 mg/dL 17   14   Creatinine 9.38 - 1.24 mg/dL 9.17  8.99  9.18   Sodium 135 - 145 mmol/L 140   138   Potassium 3.5 - 5.1 mmol/L 4.0   4.1   Chloride 98 - 111 mmol/L 104   103   CO2 22 - 32 mmol/L 22   22   Calcium 8.9 - 10.3 mg/dL 9.4   9.4     Assessment and Plan:  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 MRI of the thoracic spine showed severe spinal canal stenosis with spinal cord compression with edema Transfer requested by neurosurgeon as well as radiation oncologist Dr. Gillie and Dr. Maritza We will keep n.p.o. after midnight for possible operative decompression of the spine Placed on Decadron  6 mg every 6 hours as recommended by neurosurgeon Keep on as needed pain medication PT OT consultation when appropriate  Back pain likely secondary  to metastatic disease Continue pain management as above including morphine , oxycodone  and Tylenol  based on pain severity  Constipation Initiated on MiraLAX  and Colace  DVT prophylaxis-placed on Lovenox  Consider holding this on the morning of surgery   Advance Care Planning:   Code Status: Full Code full code  Consults: Radiation oncology, neurosurgery  Family Communication: None present at bedside  Severity of Illness: The appropriate patient status for this patient is INPATIENT. Inpatient status is judged to be reasonable and necessary in order to provide the required intensity of service to ensure the patient's safety. The patient's presenting symptoms, physical exam findings, and initial radiographic and laboratory data in the context of their chronic comorbidities is felt to place them at high risk for further clinical deterioration. Furthermore, it is not anticipated that the patient will be medically stable for discharge from the hospital within 2 midnights of admission.   * I certify that  at the point of admission it is my clinical judgment that the patient will require inpatient hospital care spanning beyond 2 midnights from the point of admission due to high intensity of service, high risk for further deterioration and high frequency of surveillance required.*  Author: Drue ONEIDA Potter, MD 08/16/2024 5:07 PM  For on call review [email or web address].      [1] No Known Allergies  "

## 2024-08-16 NOTE — ED Notes (Signed)
 Report attempted, nurse asked me to call her back in 5 minutes

## 2024-08-16 NOTE — ED Provider Notes (Signed)
 Signout from Dr. Patsey.  58 year old male with right renal cancer.  PET scan showing T5 lesion question cord compression.  Has some longstanding numbness weakness.  Oncology sent here to get an MRI.  Disposition per results testing. Physical Exam  BP 125/77 (BP Location: Right Arm)   Pulse 88   Temp 97.9 F (36.6 C)   Resp 18   Wt 96.6 kg   SpO2 95%   BMI 28.89 kg/m   Physical Exam  Procedures  Procedures  ED Course / MDM    Medical Decision Making Amount and/or Complexity of Data Reviewed Labs: ordered. Radiology: ordered.  Risk Prescription drug management.   4:20 PM.  Received a call from radiology showing spinal met with significant cord compression at T5 lesion.  Reviewed with patient.  Placed a call to neurosurgery.  4:35 PM.  Reviewed case with Dr. Gillie neurosurgery.  He asked if radiation oncology could weigh in.  He said he would not do emergent surgery tonight.  Would need to be in [place] for admission.  4:40 PM.  Discussed with Dr. Maritza admission oncology.  She said patient should be admitted to [facility].  Decadron  10 mg followed by 4Q6 oral.  High-dose PPI.  4:50 PM.  Neurosurgery Dr. Cabbell called back and said the patient needs a CT of his thoracic spine and will need to be on [facility].  I placed secure message to radiation oncology to let them know of the change in location.  Discussed with [facility] hospitalist Dr. Dorinda who will evaluate for admission.  Patient updated.   Andre Ozell BROCKS, MD 08/17/24 [phone]

## 2024-08-16 NOTE — Consult Note (Signed)
 Reason for Consult:thoracic spinal tumor, cord compression Referring Physician: djan, prince  Andre Donovan is an 59 y.o. male.  HPI: with known Renal CA. Has complained of back pain since July according to his wife. He underwent surgery for his kidney 08/06/2023 a cryoablation by IR. The procedure was felt to be successful. Has had followup which apparently did not show residual  disease. Andre Donovan underwent a pet scan which was suspicious for a mass in the spine. An Mri performed today revealed metastatic disease in the spine, with cord compression at T5. I was called for an opinion about operative intervention.   Past Medical History:  Diagnosis Date   Cancer [facility])    kidney cancer   History of kidney stones    Pneumonia    05/2023- went to [facility]    Past Surgical History:  Procedure Laterality Date   IR RADIOLOGIST EVAL & MGMT  06/11/2023   RADIOLOGY WITH ANESTHESIA Right 08/06/2023   Procedure: CT WITH ANESTHESIA CRYOABLATION OF RIGHT RENAL MASS;  Surgeon: Hughes Simmonds, MD;  Location: [facility];  Service: Radiology;  Laterality: Right;    History reviewed. No pertinent family history.  Social History:  reports that he has been smoking cigarettes. He has never used smokeless tobacco. He reports that he does not currently use drugs after having used the following drugs: Marijuana. He reports that he does not drink alcohol.  Allergies: Allergies[1]  Medications: I have reviewed the patient's current medications.  Results for orders placed or performed during the hospital encounter of 08/16/24 (from the past 48 hours)  Basic metabolic panel     Status: None   Collection Time: 08/16/24  1:16 PM  Result Value Ref Range   Sodium 140 135 - 145 mmol/L   Potassium 4.0 3.5 - 5.1 mmol/L   Chloride 104 98 - 111 mmol/L   CO2 22 22 - 32 mmol/L   Glucose, Bld 90 70 - 99 mg/dL    Comment: Glucose reference range applies only to samples taken after fasting for at least 8 hours.   BUN 17 6  - 20 mg/dL   Creatinine, Ser 9.17 0.61 - 1.24 mg/dL   Calcium 9.4 8.9 - 89.6 mg/dL   GFR, Estimated >39 >39 mL/min    Comment: (NOTE) Calculated using the CKD-EPI Creatinine Equation (2021)    Anion gap 14 5 - 15    Comment: Performed at [facility], [address] [place], [place] [address]  CBC with Differential     Status: Abnormal   Collection Time: 08/16/24  1:16 PM  Result Value Ref Range   WBC 10.9 (H) 4.0 - 10.5 K/uL   RBC 5.16 4.22 - 5.81 MIL/uL   Hemoglobin 16.3 13.0 - 17.0 g/dL   HCT 51.8 60.9 - 47.9 %   MCV 93.2 80.0 - 100.0 fL   MCH 31.6 26.0 - 34.0 pg   MCHC 33.9 30.0 - 36.0 g/dL   RDW 87.1 88.4 - 84.4 %   Platelets 186 150 - 400 K/uL   nRBC 0.0 0.0 - 0.2 %   Neutrophils Relative % 76 %   Neutro Abs 8.3 (H) 1.7 - 7.7 K/uL   Lymphocytes Relative 17 %   Lymphs Abs 1.8 0.7 - 4.0 K/uL   Monocytes Relative 6 %   Monocytes Absolute 0.6 0.1 - 1.0 K/uL   Eosinophils Relative 1 %   Eosinophils Absolute 0.1 0.0 - 0.5 K/uL   Basophils Relative 0 %   Basophils Absolute 0.0  0.0 - 0.1 K/uL   Immature Granulocytes 0 %   Abs Immature Granulocytes 0.03 0.00 - 0.07 K/uL    Comment: Performed at [facility], [address], [place], [place] [address]    MR THORACIC SPINE W WO CONTRAST Result Date: 08/16/2024 EXAM: MRI THORACIC SPINE WITH AND WITHOUT INTRAVENOUS CONTRAST 08/16/2024 03:20:34 PM TECHNIQUE: Multiplanar multisequence MRI of the thoracic spine was performed with and without the administration of intravenous contrast. COMPARISON: FDG PET/CT 08/16/2024. CLINICAL HISTORY: 59 year old male with abnormal nuclear medicine scan. Increasing leg numbness. FINDINGS: BONES AND ALIGNMENT: There are multifocal T1-hypointense, STIR-hyperintense and enhancing marrow replacing lesions throughout the spine in keeping with diffuse osseous metastatic disease. These include the partially evaluated C6 vertebral body, T2 vertebral body, T5-T8 vertebral bodies, and T10 left posterior element.  Most notably, there is extraosseous extension at T5 resulting in near circumferential spinal canal encroachment, severe spinal canal stenosis, and thoracic spinal cord compression. There is associated STIR hyperintensity within the compressed cord at T5 in keeping with edema (series 25, image 12). There is severe narrowing of the right T5-T6 neural foramen, mild left T5-T6 neural foraminal narrowing, and mild right T4-T5 neural foraminal narrowing as a result as well. SPINAL CORD: There is otherwise no abnormal intradural or intramedullary spinal cord enhancement. The conus medullaris terminates at the T12-L1 level. DEGENERATIVE CHANGES: There is multilevel degenerative changes of the thoracic spine. No high grade spinal canal stenosis, except at T5 as described above. Mild left T2-T3 neural foraminal stenosis. At T10-T11, there is mild-to-moderate right neural foraminal stenosis. At T6-C7, there is left asymmetric disc bulge. INCIDENTAL FINDINGS: Partially evaluated post treatment related changes right kidney. Subcentimeter right hepatic T2 hyperintensity is not fully characterized. Partially evaluated right thyroid nodule measures up to 1.0 cm. IMPRESSION: 1. 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 This finding was communicated to Dr. Ozell Arts at 4:20 PM. 2. Severe right T5-T6 neural foraminal narrowing. 3. Additional multifocal osseous metastatic disease throughout the spine, including at C6, T2, T6, T7, T8, and T10. Electronically signed by: Prentice Spade MD 08/16/2024 04:37 PM EST RP Workstation: [id number]    Review of Systems  Constitutional:  Positive for activity change.  HENT: Negative.    Eyes: Negative.   Respiratory: Negative.    Cardiovascular: Negative.   Gastrointestinal:  Positive for constipation.  Endocrine: Negative.   Genitourinary:        Prostate CA  Musculoskeletal:   Positive for back pain.  Skin: Negative.   Allergic/Immunologic: Negative.   Neurological:  Positive for numbness.  Hematological: Negative.   Psychiatric/Behavioral: Negative.     Blood pressure 138/86, pulse 90, temperature 97.8 F (36.6 C), temperature source Oral, resp. rate 18, weight 96.6 kg, SpO2 100%. Physical Exam Constitutional:      General: He is not in acute distress.    Appearance: Normal appearance. He is normal weight.  HENT:     Head: Normocephalic and atraumatic.     Right Ear: External ear normal.     Left Ear: External ear normal.     Nose: Nose normal.     Mouth/Throat:     Mouth: Mucous membranes are moist.     Pharynx: Oropharynx is clear.  Eyes:     Extraocular Movements: Extraocular movements intact.     Conjunctiva/sclera: Conjunctivae normal.     Pupils: Pupils are equal, round, and reactive to light.  Cardiovascular:  Rate and Rhythm: Normal rate and regular rhythm.  Pulmonary:     Effort: Pulmonary effort is normal.  Abdominal:     General: Abdomen is flat.  Musculoskeletal:        General: Normal range of motion.     Cervical back: Normal range of motion.  Skin:    General: Skin is warm and dry.  Neurological:     Mental Status: He is alert and oriented to person, place, and time. Mental status is at baseline.     Cranial Nerves: Cranial nerves 2-12 are intact.     Sensory: Sensory deficit present.     Motor: Motor function is intact.     Coordination: Coordination abnormal.     Gait: Gait abnormal.     Deep Tendon Reflexes: Reflexes abnormal. Babinski sign absent on the right side. Babinski sign absent on the left side.     Reflex Scores:      Tricep reflexes are 2+ on the right side and 2+ on the left side.      Bicep reflexes are 2+ on the right side and 2+ on the left side.      Brachioradialis reflexes are 2+ on the right side and 2+ on the left side.      Patellar reflexes are 3+ on the right side and 3+ on the left side.       Achilles reflexes are 2+ on the right side and 2+ on the left side.    Comments: Spastic gate Hyper reflexic at the kness, ankles Decreased light touch 5/5 strength in all extremities   Psychiatric:        Mood and Affect: Mood normal.        Behavior: Behavior normal.        Thought Content: Thought content normal.        Judgment: Judgment normal.     Assessment/Plan: THIAGO RAGSDALE is a 58 y.o. male With a large compressive mass at T5. I believe a combination of radiation/chemo along with operative decompression and stabilization is his best option. Do not plan on tomorrow for the OR. Will need input from Oncology and Radiation Oncology before final decision.   Rockey Peru 08/16/2024, 8:22 PM          [1] No Known Allergies

## 2024-08-16 NOTE — Progress Notes (Unsigned)
 Andre Simmonds, MD  Andre Donovan PROCEDURE / BIOPSY REVIEW Date: 08/16/24  Requested Biopsy site: Bone Reason for request: Lesion. Imaging review: Best seen on PET CT  Decision: Approved Imaging modality to perform: CT Schedule with: Moderate Sedation Schedule for: Any VIR  Additional comments: @VIR : Donovan iliac bone lesion. @Schedulers . CT Donovan iliac bone lesion Bx. Mod Sed.  Please contact me with questions, concerns, or if issue pertaining to this request arise.  [name], MD [facility]       Previous Messages    ----- Message ----- From: Andre Donovan Sent: 08/16/2024  11:09 AM EST To: Andre Donovan Andre; Taryn F Rigney, RT; Ir Proc* Subject: CT BIOPSY                                      Procedure :CT BIOPSY  Reason :bones lesions concerning for metastatic disease Dx: Bone lesion [F10.0 (ICD-10-CM)]    History :NM PET Image Initial (PI) Skull Base To Thigh,CT RENAL ABD W/WO ,MR BRAIN WO CONTRAST ,  Provider: Neomi Johnston DASEN, PA-C  Provider contact ; [phone]

## 2024-08-16 NOTE — ED Triage Notes (Signed)
 Pt sent by oncology for MRI thoracic to rule out cord compression after PET scan results(see oncology note) and pt reporting increased leg numbness.

## 2024-08-16 NOTE — ED Provider Notes (Signed)
 " [facility] AT [facility] Provider Note   CSN: [id number] Arrival date & time: 08/16/24  1155     Patient presents with: Back Pain   Andre Donovan is a 59 y.o. male.  {Add pertinent medical, surgical, social history, OB history to YEP:67052}  Back Pain Patient presents after outpatient PET findings.  Has a history of right kidney clear-cell renal carcinoma.  Had PET scan done that showed a T5 lesion potentially with cord compression.  States he has been feeling weak over the last months.  Worse the last month.  States he also feels somewhat numb in the legs.  No loss of bladder or bowel control.  Still ambulatory but states it tingles on his extremities.    Past Medical History:  Diagnosis Date   Cancer [facility])    kidney cancer   History of kidney stones    Pneumonia    05/2023- went to [facility]    Prior to Admission medications  Medication Sig Start Date End Date Taking? Authorizing Provider  acetaminophen  (TYLENOL ) 325 MG tablet Take 650 mg by mouth every 6 (six) hours as needed for moderate pain (pain score 4-6).    [provider]  meclizine  (ANTIVERT ) 25 MG tablet Take 1 tablet (25 mg total) by mouth 3 (three) times daily as needed for dizziness. 04/20/24   Zammit, Joseph, MD  ondansetron  (ZOFRAN -ODT) 4 MG disintegrating tablet 4mg  ODT q4 hours prn nausea/vomit 04/20/24   Zammit, Joseph, MD    Allergies: Patient has no known allergies.    Review of Systems  Musculoskeletal:  Positive for back pain.    Updated Vital Signs BP 125/77 (BP Location: Right Arm)   Pulse 88   Temp 97.9 F (36.6 C)   Resp 18   Wt 96.6 kg   SpO2 95%   BMI 28.89 kg/m   Physical Exam Vitals and nursing note reviewed.  Cardiovascular:     Rate and Rhythm: Normal rate.  Neurological:     Mental Status: He is alert.     Comments: Able to stand.  Able to squat somewhat but somewhat decreased strength.  Sensation grossly intact on bilateral lower  extremities but states there is some paresthesias with it.     (all labs ordered are listed, but only abnormal results are displayed) Labs Reviewed  CBC WITH DIFFERENTIAL/PLATELET - Abnormal; Notable for the following components:      Result Value   WBC 10.9 (*)    Neutro Abs 8.3 (*)    All other components within normal limits  BASIC METABOLIC PANEL WITH GFR    EKG: None  Radiology: No results found.  {Document cardiac monitor, telemetry assessment procedure when appropriate:32947} Procedures   Medications Ordered in the ED  gadobutrol  (GADAVIST ) 1 MMOL/ML injection 9 mL (9 mLs Intravenous Contrast Given 08/16/24 1424)      {Click here for ABCD2, HEART and other calculators REFRESH Note before signing:1}                              Medical Decision Making Amount and/or Complexity of Data Reviewed Labs: ordered. Radiology: ordered.  Risk Prescription drug management.   Patient with mid back pain.  Has a T5 lesion along with some other lesions on PET scan.  However T5 potentially cord impaction.  Will get MRI to further evaluate.  Potentially would need admission to the hospital for radiation or surgery.   {  Document critical care time when appropriate  Document review of labs and clinical decision tools ie CHADS2VASC2, etc  Document your independent review of radiology images and any outside records  Document your discussion with family members, caretakers and with consultants  Document social determinants of health affecting pt's care  Document your decision making why or why not admission, treatments were needed:32947:::1}   Final diagnoses:  None    ED Discharge Orders     None        "

## 2024-08-16 NOTE — ED Notes (Signed)
 [facility] at bedside. This RN has called and updated the patient's wife, per pt request.

## 2024-08-16 NOTE — Progress Notes (Signed)
 Patient ID: Andre Donovan, male   DOB: 1966/07/10, 59 y.o.   MRN: [id number] Films reviewed. Multiple spinal lesions identified on MRI with and without contrast. Severe canal stenosis with associated cord signal. Believe he will need operative decompression and adjuvant radiation to the spine. History of renal cell CA. Should be transferred to [facility] as there is no operative option at [facility].  Will need a CT thoracic spine, consultation by his oncologist. Continue decadron , 6mg  Q6. Please call upon arrival to [facility].

## 2024-08-16 NOTE — ED Notes (Signed)
 The patient reports his numbness is getting worse and is upset that the MRI has not resulted yet. Dr. Towana informed and came to bedside to see the patient.

## 2024-08-16 NOTE — Plan of Care (Signed)
 Received call about Mr. Andre Donovan, a 59 yo M w/ a hx of clear cell RCC which is s/p cryoablation 08/06/2023. He's been on surveillance with regular CT Renal Abdomen scans. Most recent scan performed on 07/23/24 and revealed a large new lytic lesion in the L iliac bone and at L3. At recent fu w/ medical oncology he reported new 10/10 back pain as well as bilateral lower extremity numbness. Subsequent PET revealed widespread osseous metastatic disease with a large T5 vertebral body lesion with intraspinal extension. He presented to the ER for further evaluation and underwent thoracic MRI [facility] which demonstrated extraosseous extension of vertebral metastatic disease at T5 resulting in severe spinal canal stenosis and spinal cord compression with edema.   He is currently at [facility]. Recommended neurosurgery consult and if not a surgical candidate we can do radiation. He would need to be transferred to [facility] for radiation if he does not undergo decompression with neurosurgery.  In the interim, recommended 10 mg IV loading dose of decadron  followed by 4 mg q6 hrs as well as high dose PPI.

## 2024-08-17 ENCOUNTER — Other Ambulatory Visit: Payer: Self-pay

## 2024-08-17 LAB — CBC
HCT: 48.8 % (ref 39.0–52.0)
Hemoglobin: 17 g/dL (ref 13.0–17.0)
MCH: 31.8 pg (ref 26.0–34.0)
MCHC: 34.8 g/dL (ref 30.0–36.0)
MCV: 91.4 fL (ref 80.0–100.0)
Platelets: 156 10*3/uL (ref 150–400)
RBC: 5.34 MIL/uL (ref 4.22–5.81)
RDW: 12.4 % (ref 11.5–15.5)
WBC: 5.6 10*3/uL (ref 4.0–10.5)
nRBC: 0 % (ref 0.0–0.2)

## 2024-08-17 LAB — BASIC METABOLIC PANEL WITH GFR
Anion gap: 11 (ref 5–15)
BUN: 18 mg/dL (ref 6–20)
CO2: 23 mmol/L (ref 22–32)
Calcium: 9.8 mg/dL (ref 8.9–10.3)
Chloride: 101 mmol/L (ref 98–111)
Creatinine, Ser: 0.86 mg/dL (ref 0.61–1.24)
GFR, Estimated: >60 mL/min
Glucose, Bld: 137 mg/dL — ABNORMAL HIGH (ref 70–99)
Potassium: 4.6 mmol/L (ref 3.5–5.1)
Sodium: 135 mmol/L (ref 135–145)

## 2024-08-17 MED ORDER — PANTOPRAZOLE SODIUM 40 MG PO TBEC
40.0000 mg | DELAYED_RELEASE_TABLET | Freq: Every day | ORAL | Status: DC
  Administered 2024-08-20 – 2024-08-23 (×4): 40 mg via ORAL
  Filled 2024-08-17 (×4): qty 1

## 2024-08-17 MED ORDER — ALPRAZOLAM 0.25 MG PO TABS: 1.0000 mg | ORAL_TABLET | Freq: Three times a day (TID) | ORAL | Status: DC | PRN

## 2024-08-17 NOTE — Discharge Instructions (Signed)
 "  [employer] [facility]eless[facility] Coordinated Entry Program. If you are experiencing homelessness in [place], [place] , your first point of contact should be Pensions Consultant. You can reach Coordinated Entry by calling [phone] or by emailing [email or web address].  Community access points: [employer] [phone] [address], [place]) every Tuesday from 9am-10am. [facility] ([address] [place]) every Wednesday from 8am-9am.   -The [employer] 867-856-2724) offers several services to local families, as funding allows. The Emergency Assistance Program (EAP), which they administer, provides household goods, free food, clothing, and financial aid to people in need in the [place]  area. The EAP program does have some qualification, and counselors will interview clients for financial assistance by written referral only. Referrals need to be made by the Department of Social Services or by other EAP approved human services agencies or charities in the area.  -[facility] of [employer], which can be reached at [phone], offers emergency assistance programs for those in need of help, such as food, rent assistance, a soup kitchen, shelter, and clothing. They are based in [facility] [place]  but provide a number of services to those that qualify for assistance.   [facility] Department of Social Services may be able to offer temporary financial assistance and cash grants for paying rent and utilities, Help may be provided for local county residents who may be experiencing personal crisis when other resources, including government programs, are not available. Call [phone]  -[facility] [employer] is a [employer] agency, The organization can offer emergency assistance for paying rent, caremark rx, utilities, food,  household products and furniture. They offer extensive emergency and transitional housing for families, children and single women, and also run a Boy's and [employer]. Thrift Shops, Secondary School Teacher, and other aid offered too. [address] [place], [place]  [address], [phone]  -[place] Low Income Energy Assistance Program -- This is offered for [facility] families. The federal government created [employer] Program provides a one-time cash grant payment to help eligible low-income families pay their electric and heating bills. [address] [place], [place]  [address], [phone]  -[place] Emergency Assistance -- A program offers emergency utility and rent funds for greater [place] area residents. The program can also provide counseling and referrals to charities and government programs. Also provides food and a free meal program that serves lunch Mondays - Saturdays and dinner seven days per week to individuals in the community. [address], [place], [place]  [address], [phone]  -[employer] - Offers affordable apartment and housing communities across      [place] and [place]. The low income and seniors can access public housing, rental assistance to qualified applicants, and apply for the section 8 rent subsidy program. Other programs include Chiropractor and Engineer, Maintenance. [address] [place], [place]  [address], dial [phone].  -[facility] provides transitional housing to veterans and the disabled. Clients will also access other services too, including assistance in applying for Disability, life skills classes, case management, and assistance in finding permanent housing. [address] [place], [place]  [address], call [phone]  -[facility] [place] Transitional Housing through [employer] is for  people who were just evicted or that are formerly homeless. The non-profit will also help then gain self-sufficiency, find a home or apartment  to live in, and also provides information on rent assistance when needed. Phone [phone]  -The [facility] helps low income, elderly, or disabled residents in seven counties in the [place] ([place], [place], [place], [place], [place], [place], [place], and [place]) save energy and reduce their utility bills by improving energy efficiency. Phone [phone].  -[employer] is located in the [place] [facility] in the [employer], [address], [place], [place] [address]. Parking is in the rear of the building. Phone: [phone]   General Email: info@gsohc .org  [facility] provides free housing counseling assistance in locating affordable rental housing or housing with support services for families and individuals in crisis and the chronically homeless. We provide potential resources for other housing needs like utilities. Our trained counselors also work with clients on budgeting and financial literacy in effort to empower them to take control of their financial situations. [employer] collaborates with homeless service providers and other stakeholders as part of the [employer] COC (Continuum of Care). The (COC) is a regional/local planning body that coordinates housing and services funding for homeless families and individuals. The role of [facility] in the COC is through housing counseling to work with people we serve on diversion strategies for those that are at imminent risk of becoming homeless. We also work with the Coordinated Assessment/Entry Specialist who attempts to find temporary solutions and/or connects the people to Housing First, Rapid Re-housing or transitional housing programs. Our Homelessness Prevention Housing Counselors meet  with clients on business days (Monday-Fridays, except scheduled holidays) from 8:30 am to 4:30 pm.  Legal assistance for evictions, foreclosure, and more -If you need free legal advice on civil issues, such as foreclosures, evictions, electronics engineer, government programs, domestic issues and more, Armed Forces Operational Officer Aid of [place]  [facility]) is a associate professor firm that provides free legal services and counsel to lower income people, seniors, disabled, and others, The goal is to ensure everyone has access to justice and fair representation. Call them at [phone].  [facility] for Housing and Community Studies can provide info about obtaining legal assistance with evictions. Phone [phone]. Data Processing Manager  The [employer]. offers job and dispensing optician. Resources are focused on helping students obtain the skills and experiences that are necessary to compete in today's challenging and tight job market. The non-profit faith-based community action agency offers internship trainings as well as classroom instruction. Classes are tailored to meet the needs of people in the [facility] region. [place], [place] [address], [phone] Foreclosure Prevention/Debt Services Family Services of the [employer] Credit Counseling Service inludes debt and foreclosure prevention programs for local families. This includes money management, financial advice, budget review and development of a written action plan with a pensions consultant to help solve specific individual financial problems. In addition, housing and mortgage counselors can also provide pre- and post-purchase homeownership counseling, default resolution counseling (to prevent foreclosure) and reverse mortgage counseling. A Debt Management Program allows people and families with a high level of credit card or medical debt to consolidate and repay consumer debt and loans to  creditors and rebuild positive credit ratings and scores. Contact ([phone] D7650557. Community Clinics in [place] -Health Department [facility]: [address] [place], [place], [address]. [phone].  -Health Department [place] [facility]: [address], [place], [address]. [phone].  -[facility] offers medical care through a group of doctors, pharmacies  and other healthcare related agencies that offer services for low income, uninsured adults in [place]. Also offers adult Dental care and assistance with applying for an [employer]. Call [phone].   [facility]. This center provides low-cost health care to those without health insurance. Services offered include an onsite pharmacy. Phone [phone]. [address], [place].  -Medication Assistance Program serves as a link between pharmaceutical companies and patients to provide low cost or free prescription medications. This service is available for residents who meet certain income restrictions and have no insurance coverage. PLEASE CALL [phone]) [phone] [place]) OR ([phone] ([place])  -One Step Further: Materials Engineer, The [employer] Support & Nutrition Program, [place]. Call [phone]/ [phone].  Food Emergency Planning/management Officer -[facility]Food Bank: [address], [place] [address]. Phone [phone]  -[facility]: [address], [place], [place] [address]. [phone].  -[place] Food Finder: https://findfood.bargaincontractor.si  FLEEING VIOLENCE  -Family Services of the [facility]- 24/7 Crisis line [phone]) -[facility] Family Justice Centers: (430) [phone]) 641-SAFE (260)[phone])  Transportation  -[facility] for an application. No fee for people over the age of 34. P: [phone]. Address: [address], [place], [place] [address]  -Senior Wheels Transportation-Age 88 and over. Limit of 1 ride per week for ambulatory participants. Limit 1 ride per month for non-ambulatory participants needing wheelchair transportation. Contact: ([phone]) [phone] ([place]), [phone] [facility]).  -Access [place]: Available for people with disabilities who are unable to use fixed-route bus service. Fee applies. Contact: [phone] (For application requests)/(336) [phone] (Customer service)/(336) [phone] (I-ride reservation line)/(336) [phone] (Access [place] reservation line)   Building Services Engineer.org  Homeless Day Center  -[facility])   Day Center: M-F 8a-3p [address]  [place], [place] [address] [phone] Services include: laundry, barbering, support groups, case management, phone & computer access, showers, AA/NA mtgs, mental health/substance abuse nurse, job skills class, disability information, VA assistance, spiritual classes, etc.        HOMELESS SHELTERS [employer] Shelter at [employer]- Call [phone] ext. 347 or ext. 336. Located at [address], [place], [place] [address]  [facility] Mens Shelter- Call [phone]. Located at [address] [place] [address] [place] [address].  [facility]- [employer]. Call [phone]. Office located at [address], [place] [address].  [facility] Family [facility] through [place] [phone].  [facility]- Call [phone]. Located at [address] [place], [place], [place] [address].  [facility] at the Inn-For Pregnant mothers. Call [phone]. Located at [address] [place], [address].  [facility]-For men in [place]. Call [phone].  Home of [employer] for [employer] [phone]. Office located at [address] [place], [address].  [employer] be agreeable to help with chores. Call [phone] ext. 5000.  Men's: [address] [place], [place] [address]. Women's: [facility]  [address] [place], [place] [address]   Crisis Services [facility] Mobile Crisis Management- [phone]  [facility] [address] [place], [place] [address]. Phone: [phone]   *[place] 2-1-1 is another useful way to locate resources in the community. Visit shedsizes.ch to find service information online. If you need additional assistance, 2-1-1 Referral Specialists are available 24 hours a day, every day by dialing 2-1-1 or  [phone] from any phone. The call is free, confidential, and available in any language. "

## 2024-08-17 NOTE — Progress Notes (Signed)
 Pt transferred from [facility] to [facility] 3W-15. Pt is alert and fully oriented x 4, afebrile, NSR, stable hemodynamically as documented below. No distress at arrival. Pt denies pain.Pt is able to ambulate independently in his room. He has mild decrease sensation on both legs, but has 5/5 motor strength bilaterally.   Plan of care is reviewed. We will monitor.     08/16/24 1852  Vitals  Temp 97.8 F (36.6 C)  Temp Source Oral  BP 138/86  MAP (mmHg) 100  BP Location Right Arm  BP Method Automatic  Patient Position (if appropriate) Sitting  Pulse Rate 90  Pulse Rate Source Dinamap  ECG Heart Rate 84  Resp 18  Level of Consciousness  Level of Consciousness Alert  MEWS COLOR  MEWS Score Color Green  Oxygen Therapy  SpO2 100 %  O2 Device Room Air  Pain Assessment  Pain Scale 0-10  Pain Score 0   Wendi Dash, RN

## 2024-08-17 NOTE — Progress Notes (Signed)
 Patient ID: Andre Donovan, male   DOB: Jun 27, 1966, 59 y.o.   MRN: [id number] BP 125/67 (BP Location: Left Arm)   Pulse 87   Temp (!) 97.3 F (36.3 C) (Oral)   Resp 18   Wt 96.6 kg   SpO2 97%   BMI 28.89 kg/m  Alert and oriented x 4 Moving all extremities Spoke about surgery tomorrow and expectations.  Risks including spinal cord damage, paralysis, bowel and or bladder dysfunction, hardware failure, bleeding, infection, inability to resect tumor, weakness in one or both lower extremities, and other risks were discussed. He understands and wishes to proceed.

## 2024-08-17 NOTE — Progress Notes (Signed)
 " PROGRESS NOTE    Andre Donovan  FMW:[id number] DOB: 18-Jun-1966 DOA: 08/16/2024 PCP: Orpha Yancey LABOR, MD   Brief Narrative: 59 year old with past medical history significant for renal cell carcinoma diagnosed October 2024 after he had presented to the hospital with pneumonia, CT scan chest incidentally showed right kidney mass subsequently underwent MRI that showed right renal mass.  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  Since then patient has been on surveillance by oncology.  He reports numbness and tingling bilateral leg since Christmas gait problems, back pain.  Oncologist subsequently ordered a PET scan on 08/02/2024.  PET scan with finding of multifocal osseous lesions concerning for metastatic disease, concerning for eminent cord compression, patient was referred to the emergency department for further evaluation.  MRI thoracic spine with and without contrast showed x-ray versus extension of vertebral metastatic disease at T5 resulting in severe spinal canal stenosis and spinal cord compression with edema.  Severe right T5-T6 foraminal narrowing.  Multifocal osseous metastatic disease throughout the spine including C6, T2, T6, T7, T8, T10.  Case was discussed with neurosurgeon on-call Dr. Gillie and recommendation was to transfer to [facility] for further evaluation.   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 Gillie with neurosurgery consulted -Radiation oncology Dr. Maritza consulted -Continue with Decadron  6 mg IV Q 6 hours.  -Evaluated by Dr. Gillie, plan for surgical intervention this hospitalization, also evaluated by radiation oncology plan to  start radiation after surgery - Continue oxycodone  and morphine  as needed   Constipation - Continue with MiraLAX  and docusate     Estimated body mass index is 28.89 kg/m as calculated from the following:   Height as of 08/02/24: 6' (1.829 m).   Weight as of this encounter: 96.6 kg.   DVT prophylaxis: Lovenox  Code Status: Full code Family Communication: Care discussed with patient Disposition Plan:  Status is: Inpatient Remains inpatient appropriate because: Management of back pain and cord compression    Consultants:  Dr. Gillie Dr. Estefana Maritza  radiation oncologist  Procedures:  none  Antimicrobials:    Subjective: He is alert and conversant, he is able to move bilateral lower extremity.  He reports numbness and tingling in his leg and feet.  Also reported numbness and tingling in his abdomen Reports pain is controlled currently with pain medication Objective: Vitals:   08/16/24 1852 08/17/24 0012 08/17/24 0524 08/17/24 0721  BP: 138/86 113/69 120/77 125/84  Pulse: 90 83 84 87  Resp: 18 18 18 17   Temp: 97.8 F (36.6 C) 97.7 F (36.5 C) 97.7 F (36.5 C) (!) 97.4 F (36.3 C)  TempSrc: Oral Oral Oral Oral  SpO2: 100% 97% 96% 97%  Weight:       No intake or output data in the 24 hours ending 08/17/24 0756 Filed Weights   08/16/24 1235  Weight: 96.6 kg    Examination:  General exam: Appears calm and comfortable  Respiratory system: Clear to auscultation. Respiratory effort normal. Cardiovascular system: S1 & S2 heard, RRR. No JVD, murmurs, rubs, gallops or clicks. No pedal edema. Gastrointestinal system: Abdomen is nondistended, soft and nontender. No organomegaly or masses felt. Normal bowel sounds heard. Central nervous system: Alert and oriented. No  focal neurological deficits. Extremities: Symmetric 5 x 5 power.   Data Reviewed: I have personally reviewed following labs and imaging studies  CBC: Recent Labs  Lab 08/16/24 1316 08/17/24 0532   WBC 10.9* 5.6  NEUTROABS 8.3*  --   HGB 16.3 17.0  HCT 48.1 48.8  MCV 93.2 91.4  PLT 186 156   Basic Metabolic Panel: Recent Labs  Lab 08/16/24 1316 08/17/24 0532  NA 140 135  K 4.0 4.6  CL 104 101  CO2 22 23  GLUCOSE 90 137*  BUN 17 18  CREATININE 0.82 0.86  CALCIUM 9.4 9.8   GFR: Estimated Creatinine Clearance: 110.1 mL/min (by C-G formula based on SCr of 0.86 mg/dL). Liver Function Tests: No results for input(s): AST, ALT, ALKPHOS, BILITOT, PROT, ALBUMIN  in the last 168 hours. No results for input(s): LIPASE, AMYLASE in the last 168 hours. No results for input(s): AMMONIA in the last 168 hours. Coagulation Profile: No results for input(s): INR, PROTIME in the last 168 hours. Cardiac Enzymes: No results for input(s): CKTOTAL, CKMB, CKMBINDEX, TROPONINI in the last 168 hours. BNP (last 3 results) No results for input(s): PROBNP in the last 8760 hours. HbA1C: No results for input(s): HGBA1C in the last 72 hours. CBG: No results for input(s): GLUCAP in the last 168 hours. Lipid Profile: No results for input(s): CHOL, HDL, LDLCALC, TRIG, CHOLHDL, LDLDIRECT in the last 72 hours. Thyroid Function Tests: No results for input(s): TSH, T4TOTAL, FREET4, T3FREE, THYROIDAB in the last 72 hours. Anemia Panel: No results for input(s): VITAMINB12, FOLATE, FERRITIN, TIBC, IRON, RETICCTPCT in the last 72 hours. Sepsis Labs: No results for input(s): PROCALCITON, LATICACIDVEN in the last 168 hours.  No results found for this or any previous visit (from the past 240 hours).       Radiology Studies: CT THORACIC SPINE WO CONTRAST Result Date: 08/16/2024 CLINICAL DATA:  Follow-up examination for spine metastases. EXAM: CT THORACIC SPINE WITHOUT CONTRAST TECHNIQUE: Multidetector CT images of the thoracic were obtained using the standard protocol without intravenous contrast. RADIATION DOSE REDUCTION: This exam  was performed according to the departmental dose-optimization program which includes automated exposure control, adjustment of the mA and/or kV according to patient size and/or use of iterative reconstruction technique. COMPARISON:  Comparison made with MRI performed earlier the same day. FINDINGS: Alignment: Mild straightening of the normal thoracic kyphosis. No listhesis or static subluxation. Vertebrae: Lytic destructive mass involving the posterior aspect of the T5 vertebral body, with extension into the posterior elements again seen, consistent with metastatic disease. The bilateral pedicles and lamina of T5 are largely obliterated, with involvement of the right greater than left transverse processes. Inferior extension to involve the right posterior elements of T6 (series 7, image 36). Superior extension to partially involve the T4 spinous process noted as well. Epidural extension with secondary severe spinal stenosis and cord compression, better appreciated on prior MRI. Associated severe bilateral foraminal narrowing at T5-6 again noted as well, worse on the right. Multiple additional osseous metastases are seen, most notably at the C7, T2 and T6 vertebral bodies. Additional lesion involving the left posterior elements at T10 noted (series 7, image 47). The lesion at T2 extends to partially involve the right pedicle of T2 (series 7, image 48). Additional probable lesion at T8 better seen on prior MRI. Associated mild pathologic compression fracture of T2 noted. Vertebral body height otherwise relatively maintained at this time. No other significant extra osseous or epidural extension of tumor evident by CT. Paraspinal and other  soft tissues: Paraspinous soft tissues demonstrate no other acute finding. Mild aortic atherosclerosis. Postoperative changes noted at the partially visualized right kidney. Disc levels: Underlying mild for age thoracic spondylosis. Other than the severe stenosis at T5 related to  compressive tumor, no other significant spinal stenosis is evident by CT. Mild left foraminal narrowing at T2-3, with moderate right foraminal narrowing at T10-11, described on prior CT MRI. IMPRESSION: 1. Lytic destructive mass involving the posterior aspect of the T5 vertebral body, with extension into the posterior elements of T4 through T6, consistent with metastatic disease. Epidural extension with secondary severe spinal stenosis and cord compression, better appreciated on prior MRI. Associated severe bilateral foraminal narrowing at T5-6, worse on the right. 2. Multiple additional osseous metastases involving the C7, T2, and T6 vertebral bodies. Previously identified lesion at T8 better seen on prior MRI. Associated mild pathologic compression fracture at T2. Aortic Atherosclerosis (ICD10-I70.0). Electronically Signed   By: Morene Hoard M.D.   On: 08/16/2024 22:36   MR THORACIC SPINE W WO CONTRAST Result Date: 08/16/2024 EXAM: MRI THORACIC SPINE WITH AND WITHOUT INTRAVENOUS CONTRAST 08/16/2024 03:20:34 PM TECHNIQUE: Multiplanar multisequence MRI of the thoracic spine was performed with and without the administration of intravenous contrast. COMPARISON: FDG PET/CT 08/16/2024. CLINICAL HISTORY: 59 year old male with abnormal nuclear medicine scan. Increasing leg numbness. FINDINGS: BONES AND ALIGNMENT: There are multifocal T1-hypointense, STIR-hyperintense and enhancing marrow replacing lesions throughout the spine in keeping with diffuse osseous metastatic disease. These include the partially evaluated C6 vertebral body, T2 vertebral body, T5-T8 vertebral bodies, and T10 left posterior element. Most notably, there is extraosseous extension at T5 resulting in near circumferential spinal canal encroachment, severe spinal canal stenosis, and thoracic spinal cord compression. There is associated STIR hyperintensity within the compressed cord at T5 in keeping with edema (series 25, image 12). There is  severe narrowing of the right T5-T6 neural foramen, mild left T5-T6 neural foraminal narrowing, and mild right T4-T5 neural foraminal narrowing as a result as well. SPINAL CORD: There is otherwise no abnormal intradural or intramedullary spinal cord enhancement. The conus medullaris terminates at the T12-L1 level. DEGENERATIVE CHANGES: There is multilevel degenerative changes of the thoracic spine. No high grade spinal canal stenosis, except at T5 as described above. Mild left T2-T3 neural foraminal stenosis. At T10-T11, there is mild-to-moderate right neural foraminal stenosis. At T6-C7, there is left asymmetric disc bulge. INCIDENTAL FINDINGS: Partially evaluated post treatment related changes right kidney. Subcentimeter right hepatic T2 hyperintensity is not fully characterized. Partially evaluated right thyroid nodule measures up to 1.0 cm. IMPRESSION: 1. 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 This finding was communicated to Dr. Ozell Arts at 4:20 PM. 2. Severe right T5-T6 neural foraminal narrowing. 3. Additional multifocal osseous metastatic disease throughout the spine, including at C6, T2, T6, T7, T8, and T10. Electronically signed by: Prentice Spade MD 08/16/2024 04:37 PM EST RP Workstation: [id number]        Scheduled Meds:  dexamethasone  (DECADRON ) injection  6 mg Intravenous Q6H   docusate sodium   100 mg Oral BID   enoxaparin  (LOVENOX ) injection  40 mg Subcutaneous Q24H   pantoprazole  (PROTONIX ) IV  40 mg Intravenous Q24H   polyethylene glycol  17 g Oral Daily   Continuous Infusions:   LOS: 1 day    Time spent: 35 Minutes    Chermaine Schnyder A Danie Diehl, MD [facility]   If 7PM-7AM, please contact night-coverage [email or web address]  08/17/2024,  7:56 AM   "

## 2024-08-17 NOTE — Consult Note (Signed)
 " Radiation Oncology         [phone] ________________________________  Name: Andre Donovan        MRN: [id number]  Date of Service: 08/16/2024 DOB: 03/25/1966  RR:Yjdjwjg, Yancey LABOR, MD  No ref. 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 male seen in consultation for radiation therapy.  He has a hx of clear cell RCC, discovered incidentally on imaging obtained in setting of pneumonia in late 2024. This is s/p biopsy and cryoablation 08/06/2023. He's been on surveillance with regular CT Renal Abdomen scans. Most recent scan performed on 07/23/24 and revealed a large new lytic lesion in the L iliac bone and at L3. At recent fu w/ medical oncology he reported new 10/10 back pain as well as bilateral lower extremity numbness. Subsequent PET obtained on 08/13/23 revealed widespread osseous metastatic disease with a large T5 vertebral body lesion with intraspinal extension. When scan was read and reported to ordering provider, the patient was told to go to the ER for further evaluation. There he underwent thoracic MRI [facility] which demonstrated extraosseous extension of vertebral metastatic disease at T5 resulting in severe spinal canal stenosis and spinal cord compression with edema.    He presented to ER at [facility] but has since transferred to [facility] for decompression surgery tomorrow with Dr. Gillie. He has been started on high dose steroids and a PPI.   Today patient reports back pain which began over the summer and which he attributed to overexertion/a pulled muscle. Developed a decrease in sensation from the mid-chest down to his feet around Christmas. He can walk but it feels like he's walking on cushions. He has not had any falls related to this decrease in sensation. Denies weakness. Not having trouble walking technically but reports he cannot run.  Denies any numbness or tingling in upper extremities.   PREVIOUS RADIATION THERAPY: No  AUTOIMMUNE DISEASE: No  MEDICAL DEVICES: No  PREGNANCY: No   PAST MEDICAL HISTORY:  Past Medical History:  Diagnosis Date   Cancer (HCC)    kidney cancer   History of kidney stones    Pneumonia    05/2023- went to [facility]       PAST SURGICAL HISTORY: Past Surgical History:  Procedure Laterality Date   IR RADIOLOGIST EVAL & MGMT  06/11/2023   RADIOLOGY WITH ANESTHESIA Right 08/06/2023   Procedure: CT WITH ANESTHESIA CRYOABLATION OF RIGHT RENAL MASS;  Surgeon: Hughes Simmonds, MD;  Location: [facility];  Service: Radiology;  Laterality: Right;     FAMILY HISTORY: History reviewed. No pertinent family history.   SOCIAL HISTORY:  reports that he has been smoking cigarettes. He has never used smokeless tobacco. He reports that he does not currently use drugs after having used the following drugs: Marijuana. He reports that he does not drink alcohol.   ALLERGIES: Patient has no known allergies.   MEDICATIONS:  Current Facility-Administered Medications  Medication Dose Route Frequency Provider Last Rate Last Admin   acetaminophen  (TYLENOL ) tablet 650 mg  650 mg Oral Q6H PRN Dorinda Drue DASEN, MD       ALPRAZolam  (XANAX ) tablet 1 mg  1 mg Oral TID PRN Gillie Duncans, MD       dexamethasone  (DECADRON ) injection 6 mg  6 mg Intravenous Q6H Dorinda Drue DASEN, MD   6 mg at 08/17/24 [phone]  docusate sodium  (COLACE) capsule 100 mg  100 mg Oral BID Djan, Prince T, MD   100 mg at 08/17/24 1022   hydrALAZINE  (APRESOLINE ) injection 10 mg  10 mg Intravenous Q6H PRN Dorinda Drue DASEN, MD       iohexol  (OMNIPAQUE ) 300 MG/ML solution 75 mL  75 mL Intravenous Once PRN Butler, Michael C, MD       morphine  (PF) 2 MG/ML injection 2 mg  2 mg Intravenous Q4H PRN Dorinda Drue DASEN, MD       ondansetron  (ZOFRAN ) injection 4 mg  4 mg Intravenous Q6H PRN Dorinda Drue DASEN, MD       oxyCODONE  (Oxy IR/ROXICODONE ) immediate release tablet 5  mg  5 mg Oral Q4H PRN Djan, Prince T, MD       [START ON 08/18/2024] pantoprazole  (PROTONIX ) EC tablet 40 mg  40 mg Oral Daily Pham, Minh Q, RPH-CPP       polyethylene glycol (MIRALAX  / GLYCOLAX ) packet 17 g  17 g Oral Daily Dorinda Drue T, MD   17 g at 08/17/24 1022     REVIEW OF SYSTEMS: Pertinent ROS per HPI and otherwise negative.    PHYSICAL EXAM:  Wt Readings from Last 3 Encounters:  08/16/24 213 lb (96.6 kg)  08/02/24 213 lb (96.6 kg)  04/20/24 200 lb (90.7 kg)   Temp Readings from Last 3 Encounters:  08/17/24 (!) 97.3 F (36.3 C) (Oral)  08/02/24 (!) 97 F (36.1 C) (Tympanic)  04/20/24 98 F (36.7 C) (Oral)   BP Readings from Last 3 Encounters:  08/17/24 125/69  08/02/24 130/72  04/20/24 113/71   Pulse Readings from Last 3 Encounters:  08/17/24 87  08/02/24 83  04/20/24 (!) 56   Pain Assessment Pain Score: 1 /10   Physical Exam Vitals and nursing note reviewed.  Constitutional:      General: He is not in acute distress. HENT:     Head: Normocephalic and atraumatic.  Eyes:     Extraocular Movements: Extraocular movements intact.  Cardiovascular:     Rate and Rhythm: Normal rate.  Pulmonary:     Effort: Pulmonary effort is normal. No respiratory distress.  Abdominal:     General: There is no distension.  Musculoskeletal:        General: No deformity.     Cervical back: Normal range of motion.  Skin:    Coloration: Skin is not jaundiced or pale.  Neurological:     Mental Status: He is alert.     Sensory: Sensory deficit (decreased sensation from midchest down to his feet bilaterally) present.     Motor: No weakness.  Psychiatric:        Mood and Affect: Mood normal.      ECOG = 2   LABORATORY DATA:  Lab Results  Component Value Date   WBC 5.6 08/17/2024   HGB 17.0 08/17/2024   HCT 48.8 08/17/2024   MCV 91.4 08/17/2024   PLT 156 08/17/2024   Lab Results  Component Value Date   NA 135 08/17/2024   K 4.6 08/17/2024   CL 101 08/17/2024    CO2 23 08/17/2024   Lab Results  Component Value Date   ALT 22 07/23/2024   AST 29 07/23/2024   ALKPHOS 121 07/23/2024   BILITOT 0.9 07/23/2024      RADIOGRAPHY: CT THORACIC SPINE WO CONTRAST Result Date: 08/16/2024 CLINICAL DATA:  Follow-up examination for spine metastases. EXAM: CT THORACIC SPINE WITHOUT CONTRAST TECHNIQUE: Multidetector CT images of the  thoracic were obtained using the standard protocol without intravenous contrast. RADIATION DOSE REDUCTION: This exam was performed according to the departmental dose-optimization program which includes automated exposure control, adjustment of the mA and/or kV according to patient size and/or use of iterative reconstruction technique. COMPARISON:  Comparison made with MRI performed earlier the same day. FINDINGS: Alignment: Mild straightening of the normal thoracic kyphosis. No listhesis or static subluxation. Vertebrae: Lytic destructive mass involving the posterior aspect of the T5 vertebral body, with extension into the posterior elements again seen, consistent with metastatic disease. The bilateral pedicles and lamina of T5 are largely obliterated, with involvement of the right greater than left transverse processes. Inferior extension to involve the right posterior elements of T6 (series 7, image 36). Superior extension to partially involve the T4 spinous process noted as well. Epidural extension with secondary severe spinal stenosis and cord compression, better appreciated on prior MRI. Associated severe bilateral foraminal narrowing at T5-6 again noted as well, worse on the right. Multiple additional osseous metastases are seen, most notably at the C7, T2 and T6 vertebral bodies. Additional lesion involving the left posterior elements at T10 noted (series 7, image 47). The lesion at T2 extends to partially involve the right pedicle of T2 (series 7, image 48). Additional probable lesion at T8 better seen on prior MRI. Associated mild  pathologic compression fracture of T2 noted. Vertebral body height otherwise relatively maintained at this time. No other significant extra osseous or epidural extension of tumor evident by CT. Paraspinal and other soft tissues: Paraspinous soft tissues demonstrate no other acute finding. Mild aortic atherosclerosis. Postoperative changes noted at the partially visualized right kidney. Disc levels: Underlying mild for age thoracic spondylosis. Other than the severe stenosis at T5 related to compressive tumor, no other significant spinal stenosis is evident by CT. Mild left foraminal narrowing at T2-3, with moderate right foraminal narrowing at T10-11, described on prior CT MRI. IMPRESSION: 1. Lytic destructive mass involving the posterior aspect of the T5 vertebral body, with extension into the posterior elements of T4 through T6, consistent with metastatic disease. Epidural extension with secondary severe spinal stenosis and cord compression, better appreciated on prior MRI. Associated severe bilateral foraminal narrowing at T5-6, worse on the right. 2. Multiple additional osseous metastases involving the C7, T2, and T6 vertebral bodies. Previously identified lesion at T8 better seen on prior MRI. Associated mild pathologic compression fracture at T2. Aortic Atherosclerosis (ICD10-I70.0). Electronically Signed   By: Morene Hoard M.D.   On: 08/16/2024 22:36   MR THORACIC SPINE W WO CONTRAST Result Date: 08/16/2024 EXAM: MRI THORACIC SPINE WITH AND WITHOUT INTRAVENOUS CONTRAST 08/16/2024 03:20:34 PM TECHNIQUE: Multiplanar multisequence MRI of the thoracic spine was performed with and without the administration of intravenous contrast. COMPARISON: FDG PET/CT 08/16/2024. CLINICAL HISTORY: 59 year old male with abnormal nuclear medicine scan. Increasing leg numbness. FINDINGS: BONES AND ALIGNMENT: There are multifocal T1-hypointense, STIR-hyperintense and enhancing marrow replacing lesions throughout the  spine in keeping with diffuse osseous metastatic disease. These include the partially evaluated C6 vertebral body, T2 vertebral body, T5-T8 vertebral bodies, and T10 left posterior element. Most notably, there is extraosseous extension at T5 resulting in near circumferential spinal canal encroachment, severe spinal canal stenosis, and thoracic spinal cord compression. There is associated STIR hyperintensity within the compressed cord at T5 in keeping with edema (series 25, image 12). There is severe narrowing of the right T5-T6 neural foramen, mild left T5-T6 neural foraminal narrowing, and mild right T4-T5 neural foraminal narrowing as a result  as well. SPINAL CORD: There is otherwise no abnormal intradural or intramedullary spinal cord enhancement. The conus medullaris terminates at the T12-L1 level. DEGENERATIVE CHANGES: There is multilevel degenerative changes of the thoracic spine. No high grade spinal canal stenosis, except at T5 as described above. Mild left T2-T3 neural foraminal stenosis. At T10-T11, there is mild-to-moderate right neural foraminal stenosis. At T6-C7, there is left asymmetric disc bulge. INCIDENTAL FINDINGS: Partially evaluated post treatment related changes right kidney. Subcentimeter right hepatic T2 hyperintensity is not fully characterized. Partially evaluated right thyroid nodule measures up to 1.0 cm. IMPRESSION: 1. 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 This finding was communicated to Dr. Ozell Arts at 4:20 PM. 2. Severe right T5-T6 neural foraminal narrowing. 3. Additional multifocal osseous metastatic disease throughout the spine, including at C6, T2, T6, T7, T8, and T10. Electronically signed by: Prentice Spade MD 08/16/2024 04:37 PM EST RP Workstation: [id number]   NM PET Image Initial (PI) Skull Base To Thigh Result Date: 08/15/2024 CLINICAL DATA:  Initial treatment  strategy for kidney cancer, active surveillance. History of right renal mass. EXAM: NUCLEAR MEDICINE PET SKULL BASE TO THIGH TECHNIQUE: 10.9 mCi F-18 FDG was injected intravenously. Full-ring PET imaging was performed from the skull base to thigh after the radiotracer. CT data was obtained and used for attenuation correction and anatomic localization. Fasting blood glucose: 120 mg/dl COMPARISON:  Abdominal CT 07/23/2024 and 11/25/2023. Abdominal MRI 05/19/2023. FINDINGS: Mediastinal blood pool activity: SUV max NECK: No hypermetabolic cervical lymph nodes are identified.There is a hypermetabolic 9 mm nodule in the left parotid lobe (image 20/202) demonstrating an SUV max of 7.5. no suspicious activity identified within the pharyngeal mucosal space. Incidental CT findings: none CHEST: There are no hypermetabolic mediastinal, hilar or axillary lymph nodes. No hypermetabolic pulmonary activity or suspicious nodularity. Incidental CT findings: Mild atherosclerosis of the aorta, great vessels and coronary arteries. ABDOMEN/PELVIS: There is no hypermetabolic activity within the liver, adrenal glands, spleen or pancreas. There is no hypermetabolic nodal activity in the abdomen or pelvis. Status post cryoablation of previously demonstrated mass in the upper pole of the right kidney. The ablation bed appears stable, measuring 3.9 cm on image 102/2, without metabolic activity. Incidental CT findings: Mild aortic atherosclerosis. Sigmoid diverticulosis. SKELETON: Widespread osseous metastatic disease with involvement of the lumbar spine and left iliac bone, as seen on recent CT. The expansile lytic lesion within the left iliac bone measures up to 6.0 x 4.3 cm on image 142/202 and has an SUV max of 4.8. There is an exophytic 3.9 cm lesion involving the right inferior pubic ramus (SUV max 4.7). Spinal involvement is greatest at T5 where there is an expansile lesion in the posterior elements measuring 4.3 x 3.3 cm on image  58/202. This demonstrates intraspinal extension and an SUV max of 4.6. There is a smaller lesion in the T2 vertebral body with an SUV max of 4.0. Lytic lesion involving the right scapular glenoid has an SUV max of 4.9. Scattered additional smaller lesions in the spine and ribs. Incidental CT findings: none IMPRESSION: 1. Widespread osseous metastatic disease with prominent involvement of the spine, bony pelvis and right scapular glenoid. 2. The largest spinal lesion is expansile within the T5 vertebral body with intraspinal extension, placing the patient at risk for cord compression. Recommend further evaluation with thoracic MRI. 3. No evidence of extra osseous metastatic disease within the lungs or abdomen. 4. Stable appearance of  the cryoablation bed in the upper pole of the right kidney. 5. 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 6.  Aortic Atherosclerosis (ICD10-I70.0). 7. These results will be called to the ordering clinician or representative by the Radiologist Assistant, and communication documented in the PACS or [employer]. Electronically Signed   By: Elsie Perone M.D.   On: 08/15/2024 11:29   CT RENAL ABD W/WO Result Date: 07/30/2024 EXAM: CT ABDOMEN WITH AND WITHOUT CONTRAST 07/23/2024 01:40:01 PM TECHNIQUE: CT of the abdomen was performed with and without the administration of 100 mL of iohexol  (OMNIPAQUE ) 300 MG/ML solution. Multiplanar reformatted images are provided for review. Automated exposure control, iterative reconstruction, and/or weight based adjustment of the mA/kV was utilized to reduce the radiation dose to as low as reasonably achievable. COMPARISON: 11/25/2023 CLINICAL HISTORY: Kidney cancer, active surveillance. FINDINGS: LOWER CHEST: Right middle lobe atelectasis. HEPATOBILIARY: 12 mm probable hemangioma in the left hepatic lobe (series 16/image 27) unchanged, benign. Gallbladder is unremarkable. SPLEEN: Spleen demonstrates no acute  abnormality. PANCREAS: Pancreas demonstrates no acute abnormality. ADRENAL GLANDS: Adrenal glands demonstrate no acute abnormality. KIDNEYS: Status post right upper pole renal partial ablation. Ablation bed measures 4.3 cm, previously 5.4 cm. Additional scattered small simple cysts bilaterally, benign (Bosniak type 1). Per consensus, no follow-up is needed for simple Bosniak type 1 and 2 renal cysts, unless the patient has a malignancy history or risk factors. No stones in the kidneys or proximal ureters. No hydronephrosis. No perinephric or periureteral stranding. GI AND BOWEL: Stomach and duodenal sweep demonstrate no acute abnormality. There is no bowel obstruction. No abnormal bowel wall thickening or distension. PERITONEUM AND RETROPERITONEUM: No ascites or free air. Atherosclerotic calcifications of the aorta, although patent. LYMPH NODES: No lymphadenopathy. BONES AND SOFT TISSUES: 4.9 cm lytic lesion in the left iliac bone near the sacroiliac joint (series 2/image 136), new from prior MRI, suspicious for lytic metastasis. Additional new lytic lesions at L3 along the right lamina (series 9/image 90) and left posterior elements (series 9/image 84). No focal soft tissue abnormality. IMPRESSION: 1. Status post right renal cryoablation with slight contraction of the ablation zone. 2. New lytic metastases at L3 and the left iliac bone. Electronically signed by: Pinkie Pebbles MD MD 07/30/2024 08:20 PM EST RP Workstation: [id number]     PATHOLOGY:    R upper renal pole lesion biopsy and ablation 08/06/23:  FINAL MICROSCOPIC DIAGNOSIS:   A. RENAL MASS, C/F RCC:       Renal cell carcinoma, clear cell type, WHO/ISUP grade 2.     IMPRESSION/PLAN:  1. Bone metastasis w/ cord compression:    - Wolm BIRCH Motz is a 59 y.o. male with a hx of RCC s/p cryoablation, presumed primary of newly identified metastatic disease in the thoracic spine with extraosseous extension and resultant severe spinal canal  stenosis and cord compression    - Currently symptomatic with decreased sensation from midchest down.    - Decompression surgery planned tomorrow with Dr. Gillie    - We reviewed the role of palliative radiation therapy for local control, pain relief, and prevention of morbidity.     - Discussed expected response rates and possible side effects including skin changes, fatigue, cytopenias (if large field), and risk of fracture.    - Discussed standard fractionation schedules (e.g., 8 Gy  1, 20 Gy  5, 30 Gy  10, or SBRT for selected cases).    - Our recommendation today is 30 Gy in 10 fractions to  the T spine and possibly to the L iliac lesion if symptomatic.    - Patient wishes to proceed.    - Simulation will be scheduled for 1 week post op in order to initiate treatment 2 weeks post op   2. Systemic therapy:    - Will need medical oncology consultation in house vs close fu with medical oncology after discharge to discuss systemic therapy for new diagnosis of metastatic disease    - Current systemic therapy: none  3. Pain management:    - Current regimen: decadron , oxycodone , morphine .  4. Supportive care:    - PT/OT referral: consider after surgery    - Social work / palliative care referral: consider at discharge  Total time spent today in preparation for this visit was 75 minutes. This included patient care, imaging and path review, documentation, multidisciplinary discussion and coordination of care and follow up.    Estefana HERO. Maritza, M.D.   "

## 2024-08-17 NOTE — Care Management (Signed)
 SDOH resources added to AVS

## 2024-08-17 NOTE — Plan of Care (Signed)
 Walked independently multiple times. Wife by his side supporting him.   Problem: Education: Goal: Knowledge of General Education information will improve Description: Including pain rating scale, medication(s)/side effects and non-pharmacologic comfort measures Outcome: Progressing   Problem: Clinical Measurements: Goal: Will remain free from infection Outcome: Progressing   Problem: Health Behavior/Discharge Planning: Goal: Ability to manage health-related needs will improve Outcome: Progressing   Problem: Activity: Goal: Risk for activity intolerance will decrease Outcome: Progressing   Problem: Nutrition: Goal: Adequate nutrition will be maintained Outcome: Progressing   Problem: Coping: Goal: Level of anxiety will decrease Outcome: Progressing   Problem: Pain Managment: Goal: General experience of comfort will improve and/or be controlled Outcome: Progressing   Problem: Safety: Goal: Ability to remain free from injury will improve Outcome: Progressing   Problem: Skin Integrity: Goal: Risk for impaired skin integrity will decrease Outcome: Progressing

## 2024-08-18 DIAGNOSIS — C649 Malignant neoplasm of unspecified kidney, except renal pelvis: Secondary | ICD-10-CM

## 2024-08-18 LAB — MISC LABCORP TEST (SEND OUT): Labcorp test code: 83935

## 2024-08-18 LAB — SURGICAL PCR SCREEN
MRSA, PCR: NEGATIVE
Staphylococcus aureus: NEGATIVE

## 2024-08-18 MED ORDER — CHLORHEXIDINE GLUCONATE CLOTH 2 % EX PADS: 6.0000 | MEDICATED_PAD | Freq: Every day | CUTANEOUS | Status: DC

## 2024-08-18 MED ORDER — CHLORHEXIDINE GLUCONATE CLOTH 2 % EX PADS
6.0000 | MEDICATED_PAD | Freq: Once | CUTANEOUS | Status: AC
  Administered 2024-08-19: 6 via TOPICAL

## 2024-08-18 NOTE — Progress Notes (Signed)
" ° ° °  PROCEDURAL EXPEDITER PROGRESS NOTE  Patient Name: Andre Donovan  DOB:Nov 28, 1965 Date of Admission: 08/16/2024  Date of Assessment:08/18/24   -------------------------------------------------------------------------------------------------------------------   Brief clinical summary: Patient going for thoracic laminectomy surgery on 08-19-24.  Orders in place:   Yes   Communication with surgical team if no orders: n/a  Labs, test, and orders reviewed: yes  Requires surgical clearance:   no  What type of clearance: n/a  Clearance received: n/a  Barriers noted:none   Intervention provided by [facility] team: none  Barrier resolved:   not applicable   -------------------------------------------------------------------------------------------------------------------  [employer], Andre Donovan Please contact us  directly via secure chat (search for [facility]) or by calling us  at [phone] [facility]).  "

## 2024-08-18 NOTE — Plan of Care (Signed)

## 2024-08-18 NOTE — Progress Notes (Signed)
 " PROGRESS NOTE    Andre Donovan  FMW:[id number] DOB: [birth year] DOA: 08/16/2024 PCP: Orpha Yancey LABOR, MD   Brief Narrative: 59 year old with past medical history significant for renal cell carcinoma diagnosed October 2024 after he had presented to the hospital with pneumonia, CT scan chest incidentally showed right kidney mass subsequently underwent MRI that showed right renal mass.  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  Since then patient has been on surveillance by oncology. He reports numbness and tingling bilateral leg since Christmas gait problems, back pain.  Oncologist subsequently ordered a PET scan on 08/02/2024.  PET scan with finding of multifocal osseous lesions concerning for metastatic disease, concerning for eminent cord compression, patient was referred to the emergency department for further evaluation. MRI thoracic spine with and without contrast showed x-ray versus extension of vertebral metastatic disease at T5 resulting in severe spinal canal stenosis and spinal cord compression with edema.  Severe right T5-T6 foraminal narrowing.  Multifocal osseous metastatic disease throughout the spine including C6, T2, T6, T7, T8, T10. Case was discussed with neurosurgeon on-call Dr. Gillie and recommendation was to transfer [facility] for further evaluation.   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 Patient underwent MRI spine which raised concern for severe spinal canal stenosis and cord compression.  Neurosurgery was consulted.  Plan is for surgical intervention during this hospitalization. Patient is on dexamethasone  6 mg every 6 hours. Patient also seen by radiation oncology with plans for radiation treatment after he has recovered from  surgery. Patient is followed by medical oncology.  Will notify them of the change in status. Pain seems to be adequately controlled.  Metastatic renal cell carcinoma See above.  Will notify medical oncology.  Constipation - Continue with MiraLAX  and docusate   DVT prophylaxis: Lovenox  Code Status: Full code Family Communication: Care discussed with patient Disposition Plan: To be determined    Consultants:  Dr. Gillie Dr. Estefana Cha  radiation oncologist  Procedures:  none  Subjective: Patient denies any significant back pain currently.  No chest pain or shortness of breath.  No nausea vomiting.  Admits to lower extremity weakness.     Objective: Vitals:   08/17/24 1517 08/17/24 2043 08/17/24 2326 08/18/24 0410  BP: 125/67 125/69 107/71 119/78  Pulse: 87  99 96  Resp: 18 20 16 16   Temp: (!) 97.3 F (36.3 C) 97.7 F (36.5 C) 97.7 F (36.5 C) (!) 97.5 F (36.4 C)  TempSrc: Oral Oral Oral Oral  SpO2: 97%  97% 98%  Weight:        Intake/Output Summary (Last 24 hours) at 08/18/2024 0926 Last data filed at 08/17/2024 2345 Gross per 24 hour  Intake 810 ml  Output --  Net 810 ml   Filed Weights   08/16/24 1235  Weight: 96.6 kg    Examination:  General appearance: Awake alert.  In no distress Resp: Clear to auscultation bilaterally.  Normal effort Cardio: S1-S2 is normal regular.  No S3-S4.  No rubs murmurs or bruit GI: Abdomen is soft.  Nontender nondistended.  Bowel sounds are present normal.  No masses organomegaly Able to move his lower extremities  Data Reviewed: I have personally reviewed following labs and reports of imaging studies  CBC: Recent Labs  Lab 08/16/24 1316 08/17/24 0532  WBC 10.9* 5.6  NEUTROABS 8.3*  --   HGB 16.3 17.0  HCT 48.1 48.8  MCV 93.2 91.4  PLT 186 156   Basic Metabolic Panel: Recent Labs  Lab 08/16/24 1316 08/17/24 0532  NA 140 135  K 4.0 4.6  CL 104 101  CO2 22 23  GLUCOSE 90 137*  BUN 17 18  CREATININE  0.82 0.86  CALCIUM 9.4 9.8   GFR: Estimated Creatinine Clearance: 110.1 mL/min (by C-G formula based on SCr of 0.86 mg/dL).   Recent Results (from the past 240 hours)  Surgical pcr screen     Status: None   Collection Time: 08/17/24 10:39 PM   Specimen: Nasal Mucosa; Nasal Swab  Result Value Ref Range Status   MRSA, PCR NEGATIVE NEGATIVE Final   Staphylococcus aureus NEGATIVE NEGATIVE Final    Comment: (NOTE) The Xpert SA Assay (FDA approved for NASAL specimens in patients 82 years of age and older), is one component of a comprehensive surveillance program. It is not intended to diagnose infection nor to guide or monitor treatment. Performed at [facility], [address], [place], [place] [address]       Radiology Studies: CT THORACIC SPINE WO CONTRAST Result Date: 08/16/2024 CLINICAL DATA:  Follow-up examination for spine metastases. EXAM: CT THORACIC SPINE WITHOUT CONTRAST TECHNIQUE: Multidetector CT images of the thoracic were obtained using the standard protocol without intravenous contrast. RADIATION DOSE REDUCTION: This exam was performed according to the departmental dose-optimization program which includes automated exposure control, adjustment of the mA and/or kV according to patient size and/or use of iterative reconstruction technique. COMPARISON:  Comparison made with MRI performed earlier the same day. FINDINGS: Alignment: Mild straightening of the normal thoracic kyphosis. No listhesis or static subluxation. Vertebrae: Lytic destructive mass involving the posterior aspect of the T5 vertebral body, with extension into the posterior elements again seen, consistent with metastatic disease. The bilateral pedicles and lamina of T5 are largely obliterated, with involvement of the right greater than left transverse processes. Inferior extension to involve the right posterior elements of T6 (series 7, image 36). Superior extension to partially involve the T4 spinous process  noted as well. Epidural extension with secondary severe spinal stenosis and cord compression, better appreciated on prior MRI. Associated severe bilateral foraminal narrowing at T5-6 again noted as well, worse on the right. Multiple additional osseous metastases are seen, most notably at the C7, T2 and T6 vertebral bodies. Additional lesion involving the left posterior elements at T10 noted (series 7, image 47). The lesion at T2 extends to partially involve the right pedicle of T2 (series 7, image 48). Additional probable lesion at T8 better seen on prior MRI. Associated mild pathologic compression fracture of T2 noted. Vertebral body height otherwise relatively maintained at this time. No other significant extra osseous or epidural extension of tumor evident by CT. Paraspinal and other soft tissues: Paraspinous soft tissues demonstrate no other acute finding. Mild aortic atherosclerosis. Postoperative changes noted at the partially visualized right kidney. Disc levels: Underlying mild for age thoracic spondylosis. Other than the severe stenosis at T5 related to compressive tumor, no other significant spinal stenosis is evident by CT. Mild left foraminal narrowing at T2-3, with moderate right foraminal narrowing at T10-11, described on prior CT MRI. IMPRESSION: 1. Lytic destructive mass involving the posterior aspect of the T5 vertebral body, with extension into the posterior elements of T4 through T6, consistent with metastatic disease. Epidural extension with secondary severe spinal stenosis and cord compression,  better appreciated on prior MRI. Associated severe bilateral foraminal narrowing at T5-6, worse on the right. 2. Multiple additional osseous metastases involving the C7, T2, and T6 vertebral bodies. Previously identified lesion at T8 better seen on prior MRI. Associated mild pathologic compression fracture at T2. Aortic Atherosclerosis (ICD10-I70.0). Electronically Signed   By: Morene Hoard M.D.    On: 08/16/2024 22:36   MR THORACIC SPINE W WO CONTRAST Result Date: 08/16/2024 EXAM: MRI THORACIC SPINE WITH AND WITHOUT INTRAVENOUS CONTRAST 08/16/2024 03:20:34 PM TECHNIQUE: Multiplanar multisequence MRI of the thoracic spine was performed with and without the administration of intravenous contrast. COMPARISON: FDG PET/CT 08/16/2024. CLINICAL HISTORY: 59 year old male with abnormal nuclear medicine scan. Increasing leg numbness. FINDINGS: BONES AND ALIGNMENT: There are multifocal T1-hypointense, STIR-hyperintense and enhancing marrow replacing lesions throughout the spine in keeping with diffuse osseous metastatic disease. These include the partially evaluated C6 vertebral body, T2 vertebral body, T5-T8 vertebral bodies, and T10 left posterior element. Most notably, there is extraosseous extension at T5 resulting in near circumferential spinal canal encroachment, severe spinal canal stenosis, and thoracic spinal cord compression. There is associated STIR hyperintensity within the compressed cord at T5 in keeping with edema (series 25, image 12). There is severe narrowing of the right T5-T6 neural foramen, mild left T5-T6 neural foraminal narrowing, and mild right T4-T5 neural foraminal narrowing as a result as well. SPINAL CORD: There is otherwise no abnormal intradural or intramedullary spinal cord enhancement. The conus medullaris terminates at the T12-L1 level. DEGENERATIVE CHANGES: There is multilevel degenerative changes of the thoracic spine. No high grade spinal canal stenosis, except at T5 as described above. Mild left T2-T3 neural foraminal stenosis. At T10-T11, there is mild-to-moderate right neural foraminal stenosis. At T6-C7, there is left asymmetric disc bulge. INCIDENTAL FINDINGS: Partially evaluated post treatment related changes right kidney. Subcentimeter right hepatic T2 hyperintensity is not fully characterized. Partially evaluated right thyroid nodule measures up to 1.0 cm. IMPRESSION: 1.  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 This finding was communicated to Dr. Ozell Arts at 4:20 PM. 2. Severe right T5-T6 neural foraminal narrowing. 3. Additional multifocal osseous metastatic disease throughout the spine, including at C6, T2, T6, T7, T8, and T10. Electronically signed by: Prentice Spade MD 08/16/2024 04:37 PM EST RP Workstation: [id number]    Scheduled Meds:  dexamethasone  (DECADRON ) injection  6 mg Intravenous Q6H   docusate sodium   100 mg Oral BID   pantoprazole   40 mg Oral Daily   polyethylene glycol  17 g Oral Daily   Continuous Infusions:   LOS: 2 days    Joette Pebbles, MD [facility]   If 7PM-7AM, please contact night-coverage [email or web address]  08/18/2024, 9:26 AM   "

## 2024-08-18 NOTE — Progress Notes (Signed)
 Wife Montie at Bedside. Was prepared for patient to have Thoracic Laminectomy today, but unfortunately has to work tomorrow and cannot be present for patient's procedure.   She would like call from MD before and ASAP after procedure tomorrow morning. She can be reached at [phone]

## 2024-08-18 NOTE — Progress Notes (Signed)
 Patient ID: Andre Donovan, male   DOB: November 19, 1965, 60 y.o.   MRN: [id number] BP 115/70 (BP Location: Right Arm)   Pulse 84   Temp 97.7 F (36.5 C) (Oral)   Resp 18   Ht 6' (1.829 m)   Wt 96.6 kg   SpO2 100%   BMI 28.89 kg/m  Alert and oriented x4, speech is clear and fluent Moving all extremities well OR is moved to tomorrow Spoke to wife and rest of family

## 2024-08-19 ENCOUNTER — Encounter (HOSPITAL_COMMUNITY)
Admission: EM | Disposition: A | Discharge status: Home / Self Care | Payer: Self-pay | Source: Home / Self Care | Attending: Internal Medicine

## 2024-08-19 ENCOUNTER — Encounter (HOSPITAL_COMMUNITY): Payer: Self-pay | Admitting: Internal Medicine

## 2024-08-19 ENCOUNTER — Inpatient Hospital Stay (HOSPITAL_COMMUNITY): Payer: Self-pay

## 2024-08-19 ENCOUNTER — Other Ambulatory Visit: Payer: Self-pay

## 2024-08-19 DIAGNOSIS — G9589 Other specified diseases of spinal cord: Secondary | ICD-10-CM

## 2024-08-19 DIAGNOSIS — C649 Malignant neoplasm of unspecified kidney, except renal pelvis: Secondary | ICD-10-CM | POA: Diagnosis present

## 2024-08-19 LAB — ABO/RH: ABO/RH(D): O POS

## 2024-08-19 LAB — POCT I-STAT 7, (LYTES, BLD GAS, ICA,H+H)
Acid-base deficit: 2 mmol/L (ref 0.0–2.0)
Bicarbonate: 23.9 mmol/L (ref 20.0–28.0)
Calcium, Ion: 1.24 mmol/L (ref 1.15–1.40)
HCT: 44 % (ref 39.0–52.0)
Hemoglobin: 15 g/dL (ref 13.0–17.0)
O2 Saturation: 99 %
Potassium: 4.9 mmol/L (ref 3.5–5.1)
Sodium: 138 mmol/L (ref 135–145)
TCO2: 25 mmol/L (ref 22–32)
pCO2 arterial: 45 mmHg (ref 32–48)
pH, Arterial: 7.333 — ABNORMAL LOW (ref 7.35–7.45)
pO2, Arterial: 160 mmHg — ABNORMAL HIGH (ref 83–108)

## 2024-08-19 LAB — PREPARE RBC (CROSSMATCH)

## 2024-08-19 MED ORDER — THROMBIN 20000 UNITS EX SOLR
CUTANEOUS | Status: AC
  Filled 2024-08-19: qty 20000

## 2024-08-19 MED ORDER — ONDANSETRON HCL 4 MG/2ML IJ SOLN
INTRAMUSCULAR | Status: DC | PRN
  Administered 2024-08-19: 4 mg via INTRAVENOUS

## 2024-08-19 MED ORDER — POTASSIUM CHLORIDE IN NACL 20-0.9 MEQ/L-% IV SOLN
INTRAVENOUS | Status: DC
  Filled 2024-08-19: qty 1000

## 2024-08-19 MED ORDER — PHENYLEPHRINE HCL-NACL 20-0.9 MG/250ML-% IV SOLN
INTRAVENOUS | Status: DC | PRN
  Administered 2024-08-19: 20 ug/min via INTRAVENOUS

## 2024-08-19 MED ORDER — OXYCODONE HCL 5 MG PO TABS
10.0000 mg | ORAL_TABLET | ORAL | Status: DC | PRN
  Administered 2024-08-19 – 2024-08-20 (×2): 10 mg via ORAL
  Filled 2024-08-19 (×2): qty 2

## 2024-08-19 MED ORDER — LIDOCAINE 2% (20 MG/ML) 5 ML SYRINGE
INTRAMUSCULAR | Status: DC | PRN
  Administered 2024-08-19: 100 mg via INTRAVENOUS

## 2024-08-19 MED ORDER — MENTHOL 3 MG MT LOZG: 1.0000 | LOZENGE | OROMUCOSAL | Status: DC | PRN

## 2024-08-19 MED ORDER — PROPOFOL 10 MG/ML IV BOLUS
INTRAVENOUS | Status: AC
  Filled 2024-08-19: qty 20

## 2024-08-19 MED ORDER — ROCURONIUM BROMIDE 10 MG/ML (PF) SYRINGE
PREFILLED_SYRINGE | INTRAVENOUS | Status: AC
  Filled 2024-08-19: qty 30

## 2024-08-19 MED ORDER — LIDOCAINE-EPINEPHRINE 0.5 %-1:200000 IJ SOLN
INTRAMUSCULAR | Status: AC
  Filled 2024-08-19: qty 50

## 2024-08-19 MED ORDER — VISTASEAL 10 ML SINGLE DOSE KIT
PACK | CUTANEOUS | Status: DC | PRN
  Administered 2024-08-19: 10 mL via TOPICAL

## 2024-08-19 MED ORDER — SODIUM CHLORIDE 0.9 % IV SOLN: 250.0000 mL | INTRAVENOUS | Status: AC

## 2024-08-19 MED ORDER — BISACODYL 5 MG PO TBEC: 5.0000 mg | DELAYED_RELEASE_TABLET | Freq: Every day | ORAL | Status: DC | PRN

## 2024-08-19 MED ORDER — MAGNESIUM CITRATE PO SOLN: 1.0000 | Freq: Once | ORAL | Status: DC | PRN

## 2024-08-19 MED ORDER — ALBUMIN HUMAN 5 % IV SOLN: INTRAVENOUS | Status: DC | PRN

## 2024-08-19 MED ORDER — DEXMEDETOMIDINE HCL IN NACL 80 MCG/20ML IV SOLN
INTRAVENOUS | Status: DC | PRN
  Administered 2024-08-19: 12 ug via INTRAVENOUS

## 2024-08-19 MED ORDER — ORAL CARE MOUTH RINSE: 15.0000 mL | Freq: Once | OROMUCOSAL | Status: AC

## 2024-08-19 MED ORDER — SODIUM CHLORIDE 0.9 % IR SOLN
Status: DC | PRN
  Administered 2024-08-19: 1000 mL

## 2024-08-19 MED ORDER — PHENYLEPHRINE 80 MCG/ML (10ML) SYRINGE FOR IV PUSH (FOR BLOOD PRESSURE SUPPORT)
PREFILLED_SYRINGE | INTRAVENOUS | Status: AC
  Filled 2024-08-19: qty 10

## 2024-08-19 MED ORDER — LABETALOL HCL 5 MG/ML IV SOLN
INTRAVENOUS | Status: DC | PRN
  Administered 2024-08-19: 10 mg via INTRAVENOUS

## 2024-08-19 MED ORDER — LACTATED RINGERS IV SOLN: INTRAVENOUS | Status: DC | PRN

## 2024-08-19 MED ORDER — DEXAMETHASONE SOD PHOSPHATE PF 10 MG/ML IJ SOLN
INTRAMUSCULAR | Status: DC | PRN
  Administered 2024-08-19: 10 mg via INTRAVENOUS

## 2024-08-19 MED ORDER — SENNA 8.6 MG PO TABS
1.0000 | ORAL_TABLET | Freq: Two times a day (BID) | ORAL | Status: DC
  Administered 2024-08-19 – 2024-08-23 (×8): 8.6 mg via ORAL
  Filled 2024-08-19 (×8): qty 1

## 2024-08-19 MED ORDER — PHENOL 1.4 % MT LIQD: 1.0000 | OROMUCOSAL | Status: DC | PRN

## 2024-08-19 MED ORDER — THROMBIN 5000 UNITS EX SOLR: OROMUCOSAL | Status: DC | PRN

## 2024-08-19 MED ORDER — ROCURONIUM BROMIDE 10 MG/ML (PF) SYRINGE
PREFILLED_SYRINGE | INTRAVENOUS | Status: DC | PRN
  Administered 2024-08-19 (×2): 30 mg via INTRAVENOUS
  Administered 2024-08-19: 70 mg via INTRAVENOUS
  Administered 2024-08-19: 100 mg via INTRAVENOUS
  Administered 2024-08-19: 70 mg via INTRAVENOUS

## 2024-08-19 MED ORDER — MIDAZOLAM HCL (PF) 2 MG/2ML IJ SOLN
INTRAMUSCULAR | Status: DC | PRN
  Administered 2024-08-19: 2 mg via INTRAVENOUS

## 2024-08-19 MED ORDER — PHENYLEPHRINE 80 MCG/ML (10ML) SYRINGE FOR IV PUSH (FOR BLOOD PRESSURE SUPPORT)
PREFILLED_SYRINGE | INTRAVENOUS | Status: DC | PRN
  Administered 2024-08-19: 40 ug via INTRAVENOUS
  Administered 2024-08-19: 80 ug via INTRAVENOUS

## 2024-08-19 MED ORDER — SODIUM CHLORIDE 0.9% FLUSH: 3.0000 mL | INTRAVENOUS | Status: DC | PRN

## 2024-08-19 MED ORDER — PROPOFOL 10 MG/ML IV BOLUS
INTRAVENOUS | Status: DC | PRN
  Administered 2024-08-19: 40 mg via INTRAVENOUS
  Administered 2024-08-19: 30 mg via INTRAVENOUS
  Administered 2024-08-19: 70 mg via INTRAVENOUS

## 2024-08-19 MED ORDER — CEFAZOLIN SODIUM-DEXTROSE 2-3 GM-%(50ML) IV SOLR
INTRAVENOUS | Status: DC | PRN
  Administered 2024-08-19 (×2): 2 g via INTRAVENOUS

## 2024-08-19 MED ORDER — SUGAMMADEX SODIUM 200 MG/2ML IV SOLN
INTRAVENOUS | Status: DC | PRN
  Administered 2024-08-19: 200 mg via INTRAVENOUS

## 2024-08-19 MED ORDER — SODIUM CHLORIDE 0.9 % IV SOLN: 10.0000 mL/h | Freq: Once | INTRAVENOUS | Status: DC

## 2024-08-19 MED ORDER — SODIUM CHLORIDE 0.9% FLUSH
3.0000 mL | Freq: Two times a day (BID) | INTRAVENOUS | Status: DC
  Administered 2024-08-19 – 2024-08-22 (×6): 3 mL via INTRAVENOUS

## 2024-08-19 MED ORDER — THROMBIN 5000 UNITS EX KIT
PACK | CUTANEOUS | Status: AC
  Filled 2024-08-19: qty 1

## 2024-08-19 MED ORDER — LIDOCAINE-EPINEPHRINE 0.5 %-1:200000 IJ SOLN
INTRAMUSCULAR | Status: DC | PRN
  Administered 2024-08-19: 10 mL
  Administered 2024-08-19: 25 mL

## 2024-08-19 MED ORDER — LIDOCAINE 2% (20 MG/ML) 5 ML SYRINGE
INTRAMUSCULAR | Status: AC
  Filled 2024-08-19: qty 5

## 2024-08-19 MED ORDER — MIDAZOLAM HCL 2 MG/2ML IJ SOLN
INTRAMUSCULAR | Status: AC
  Filled 2024-08-19: qty 2

## 2024-08-19 MED ORDER — FENTANYL CITRATE (PF) 250 MCG/5ML IJ SOLN
INTRAMUSCULAR | Status: DC | PRN
  Administered 2024-08-19: 100 ug via INTRAVENOUS
  Administered 2024-08-19: 50 ug via INTRAVENOUS
  Administered 2024-08-19: 100 ug via INTRAVENOUS

## 2024-08-19 MED ORDER — MORPHINE SULFATE (PF) 2 MG/ML IV SOLN: 2.0000 mg | INTRAVENOUS | Status: DC | PRN

## 2024-08-19 MED ORDER — FENTANYL CITRATE (PF) 250 MCG/5ML IJ SOLN
INTRAMUSCULAR | Status: AC
  Filled 2024-08-19: qty 5

## 2024-08-19 MED ORDER — THROMBIN 20000 UNITS EX SOLR: CUTANEOUS | Status: DC | PRN

## 2024-08-19 MED ORDER — SENNOSIDES-DOCUSATE SODIUM 8.6-50 MG PO TABS: 1.0000 | ORAL_TABLET | Freq: Every evening | ORAL | Status: DC | PRN

## 2024-08-19 MED ORDER — BACITRACIN ZINC 500 UNIT/GM EX OINT
TOPICAL_OINTMENT | CUTANEOUS | Status: AC
  Filled 2024-08-19: qty 28.35

## 2024-08-19 MED ORDER — DIAZEPAM 5 MG PO TABS: 5.0000 mg | ORAL_TABLET | Freq: Four times a day (QID) | ORAL | Status: DC | PRN

## 2024-08-19 MED ORDER — LACTATED RINGERS IV SOLN: INTRAVENOUS | Status: DC

## 2024-08-19 MED ORDER — CHLORHEXIDINE GLUCONATE 0.12 % MT SOLN
15.0000 mL | Freq: Once | OROMUCOSAL | Status: AC
  Administered 2024-08-19: 15 mL via OROMUCOSAL

## 2024-08-19 NOTE — Progress Notes (Signed)
 Called to give report to the OR RN and she said she was doing patient's care and will call back.

## 2024-08-19 NOTE — Progress Notes (Signed)
 BP (P) 113/74   Pulse (P) 85   Temp (P) 98.6 F (37 C) (Oral)   Resp (P) 18   Ht (P) 6' (1.829 m)   Wt (P) 97.5 kg   SpO2 (P) 100%   BMI (P) 29.16 kg/m  Alert and oriented x4, speech is clear and fluent Moving all extremities For OR today

## 2024-08-19 NOTE — Progress Notes (Signed)
 " PROGRESS NOTE    Andre Donovan  FMW:[id number] DOB: 1966/04/03 DOA: 08/16/2024 PCP: Orpha Yancey LABOR, MD   Brief Narrative: 59 year old with past medical history significant for renal cell carcinoma diagnosed October 2024 after he had presented to the hospital with pneumonia, CT scan chest incidentally showed right kidney mass subsequently underwent MRI that showed right renal mass.  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  Since then patient has been on surveillance by oncology. He reports numbness and tingling bilateral leg since Christmas gait problems, back pain.  Oncologist subsequently ordered a PET scan on 08/02/2024.  PET scan with finding of multifocal osseous lesions concerning for metastatic disease, concerning for eminent cord compression, patient was referred to the emergency department for further evaluation. MRI thoracic spine with and without contrast showed x-ray versus extension of vertebral metastatic disease at T5 resulting in severe spinal canal stenosis and spinal cord compression with edema.  Severe right T5-T6 foraminal narrowing.  Multifocal osseous metastatic disease throughout the spine including C6, T2, T6, T7, T8, T10. Case was discussed with neurosurgeon on-call Dr. Gillie and recommendation was to transfer [facility] for further evaluation.   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 Patient underwent MRI spine which raised concern for severe spinal canal stenosis and cord compression.  Neurosurgery was consulted.  Plan is for surgical intervention during this hospitalization. Patient is on dexamethasone  6 mg every 6 hours. Patient also seen by radiation oncology with plans for radiation treatment after he has recovered from  surgery. Patient's oncologist, Dr. Davonna, was notified of patient's hospitalization. Patient taken to the OR this morning.  Metastatic renal cell carcinoma See above.    Constipation Continue with MiraLAX  and docusate   DVT prophylaxis: Lovenox  Code Status: Full code Family Communication: Care discussed with patient Disposition Plan: To be determined    Consultants:  Dr. Gillie Dr. Estefana Cha  radiation oncologist  Procedures:  none  Subjective: Patient seen after he returned from the OR.  Complains of pain in the back.  Otherwise denies any chest pain or shortness of breath.  No nausea or vomiting.   Objective: Vitals:   08/18/24 2039 08/19/24 0048 08/19/24 0448 08/19/24 0703  BP: 126/67 117/69 (!) 110/57 (P) 113/74  Pulse:  72  (P) 85  Resp: 18 14 16  (P) 18  Temp: 97.7 F (36.5 C) (!) 97.4 F (36.3 C) 97.8 F (36.6 C) (P) 98.6 F (37 C)  TempSrc: Oral Oral Oral (P) Oral  SpO2: 96% 97% 99% (P) 100%  Weight:    (P) 97.5 kg  Height:    (P) 6' (1.829 m)   No intake or output data in the 24 hours ending 08/19/24 1016  Filed Weights   08/16/24 1235 08/19/24 0703  Weight: 96.6 kg (P) 97.5 kg    Examination:  General appearance: Awake alert.  In no distress Resp: Clear to auscultation bilaterally.  Normal effort Cardio: S1-S2 is normal regular.  No S3-S4.  No rubs murmurs or bruit GI: Abdomen is soft.  Nontender nondistended.  Bowel sounds are present normal.  No masses organomegaly   Data Reviewed: I have personally reviewed following labs and reports of imaging studies  CBC: Recent Labs  Lab 08/16/24 1316 08/17/24 0532  WBC 10.9* 5.6  NEUTROABS 8.3*  --  HGB 16.3 17.0  HCT 48.1 48.8  MCV 93.2 91.4  PLT 186 156   Basic Metabolic Panel: Recent Labs  Lab 08/16/24 1316 08/17/24 0532  NA 140 135  K 4.0 4.6  CL 104 101  CO2 22 23  GLUCOSE 90 137*  BUN 17 18  CREATININE 0.82 0.86  CALCIUM 9.4 9.8   GFR: Estimated Creatinine  Clearance: 110.1 mL/min (by C-G formula based on SCr of 0.86 mg/dL).   Recent Results (from the past 240 hours)  Surgical pcr screen     Status: None   Collection Time: 08/17/24 10:39 PM   Specimen: Nasal Mucosa; Nasal Swab  Result Value Ref Range Status   MRSA, PCR NEGATIVE NEGATIVE Final   Staphylococcus aureus NEGATIVE NEGATIVE Final    Comment: (NOTE) The Xpert SA Assay (FDA approved for NASAL specimens in patients 62 years of age and older), is one component of a comprehensive surveillance program. It is not intended to diagnose infection nor to guide or monitor treatment. Performed at [facility], [address], [place], [place] [address]       Radiology Studies: No results found.   Scheduled Meds:  [MAR Hold] dexamethasone  (DECADRON ) injection  6 mg Intravenous Q6H   [MAR Hold] docusate sodium   100 mg Oral BID   [MAR Hold] pantoprazole   40 mg Oral Daily   [MAR Hold] polyethylene glycol  17 g Oral Daily   Continuous Infusions:  sodium chloride      lactated ringers  140 mL/hr at 08/19/24 0931     LOS: 3 days    Joette Pebbles, MD [facility]   If 7PM-7AM, please contact night-coverage [email or web address]  08/19/2024, 10:16 AM   "

## 2024-08-19 NOTE — Plan of Care (Signed)
  Problem: Education: Goal: Knowledge of General Education information will improve Description: Including pain rating scale, medication(s)/side effects and non-pharmacologic comfort measures Outcome: Progressing   Problem: Health Behavior/Discharge Planning: Goal: Ability to manage health-related needs will improve Outcome: Progressing   Problem: Clinical Measurements: Goal: Ability to maintain clinical measurements within normal limits will improve Outcome: Progressing Goal: Will remain free from infection Outcome: Progressing Goal: Diagnostic test results will improve Outcome: Progressing Goal: Respiratory complications will improve Outcome: Progressing Goal: Cardiovascular complication will be avoided Outcome: Progressing   Problem: Activity: Goal: Risk for activity intolerance will decrease Outcome: Progressing   Problem: Nutrition: Goal: Adequate nutrition will be maintained Outcome: Progressing   Problem: Coping: Goal: Level of anxiety will decrease Outcome: Progressing   Problem: Elimination: Goal: Will not experience complications related to bowel motility Outcome: Progressing Goal: Will not experience complications related to urinary retention Outcome: Progressing   Problem: Pain Managment: Goal: General experience of comfort will improve and/or be controlled Outcome: Progressing   Problem: Safety: Goal: Ability to remain free from injury will improve Outcome: Progressing   Problem: Skin Integrity: Goal: Risk for impaired skin integrity will decrease Outcome: Progressing   Problem: Education: Goal: Ability to verbalize activity precautions or restrictions will improve Outcome: Progressing Goal: Knowledge of the prescribed therapeutic regimen will improve Outcome: Progressing Goal: Understanding of discharge needs will improve Outcome: Progressing   Problem: Activity: Goal: Ability to avoid complications of mobility impairment will  improve Outcome: Progressing Goal: Ability to tolerate increased activity will improve Outcome: Progressing Goal: Will remain free from falls Outcome: Progressing   Problem: Bowel/Gastric: Goal: Gastrointestinal status for postoperative course will improve Outcome: Progressing   Problem: Clinical Measurements: Goal: Ability to maintain clinical measurements within normal limits will improve Outcome: Progressing Goal: Postoperative complications will be avoided or minimized Outcome: Progressing Goal: Diagnostic test results will improve Outcome: Progressing   Problem: Pain Management: Goal: Pain level will decrease Outcome: Progressing   Problem: Skin Integrity: Goal: Will show signs of wound healing Outcome: Progressing   Problem: Health Behavior/Discharge Planning: Goal: Identification of resources available to assist in meeting health care needs will improve Outcome: Progressing   Problem: Bladder/Genitourinary: Goal: Urinary functional status for postoperative course will improve Outcome: Progressing   Problem: Education: Goal: Ability to verbalize activity precautions or restrictions will improve Outcome: Progressing Goal: Knowledge of the prescribed therapeutic regimen will improve Outcome: Progressing Goal: Understanding of discharge needs will improve Outcome: Progressing   Problem: Activity: Goal: Ability to avoid complications of mobility impairment will improve Outcome: Progressing Goal: Ability to tolerate increased activity will improve Outcome: Progressing Goal: Will remain free from falls Outcome: Progressing   Problem: Bowel/Gastric: Goal: Gastrointestinal status for postoperative course will improve Outcome: Progressing   Problem: Clinical Measurements: Goal: Ability to maintain clinical measurements within normal limits will improve Outcome: Progressing Goal: Postoperative complications will be avoided or minimized Outcome:  Progressing Goal: Diagnostic test results will improve Outcome: Progressing   Problem: Pain Management: Goal: Pain level will decrease Outcome: Progressing   Problem: Skin Integrity: Goal: Will show signs of wound healing Outcome: Progressing   Problem: Health Behavior/Discharge Planning: Goal: Identification of resources available to assist in meeting health care needs will improve Outcome: Progressing   Problem: Bladder/Genitourinary: Goal: Urinary functional status for postoperative course will improve Outcome: Progressing

## 2024-08-19 NOTE — Anesthesia Postprocedure Evaluation (Signed)
"   Anesthesia Post Note  Patient: Andre Donovan  Procedure(s) Performed: THORACIC THREE-THORACIC SEVEN FUSION WITH PERCUTANEOUS PEDICLE SCREWS AND THORACIC FIVE TUMOR RESECTION LUMBAR PERCUTANEOUS PEDICLE SCREW 4 LEVEL     Patient location during evaluation: PACU Anesthesia Type: General Level of consciousness: awake Pain management: pain level controlled Vital Signs Assessment: post-procedure vital signs reviewed and stable Respiratory status: spontaneous breathing Cardiovascular status: blood pressure returned to baseline Postop Assessment: no apparent nausea or vomiting Anesthetic complications: no   No notable events documented.  Last Vitals:  Vitals:   08/19/24 1415 08/19/24 1430  BP: 115/70 115/78  Pulse: 78 75  Resp: 11 10  Temp:    SpO2: 95% 96%    Last Pain:  Vitals:   08/19/24 1508  TempSrc:   PainSc: 7                  Khamarion Bjelland T Colhoun      "

## 2024-08-19 NOTE — Plan of Care (Signed)
  Problem: Education: Goal: Knowledge of General Education information will improve Description: Including pain rating scale, medication(s)/side effects and non-pharmacologic comfort measures Outcome: Progressing   Problem: Clinical Measurements: Goal: Ability to maintain clinical measurements within normal limits will improve Outcome: Progressing Goal: Respiratory complications will improve Outcome: Progressing Goal: Cardiovascular complication will be avoided Outcome: Progressing   Problem: Pain Managment: Goal: General experience of comfort will improve and/or be controlled Outcome: Progressing

## 2024-08-19 NOTE — Anesthesia Procedure Notes (Signed)
 Procedure Name: Intubation Date/Time: 08/19/2024 9:08 AM  Performed by: Lockie Flesher, CRNAPre-anesthesia Checklist: Patient identified, Emergency Drugs available, Suction available and Patient being monitored Patient Re-evaluated:Patient Re-evaluated prior to induction Oxygen Delivery Method: Circle System Utilized Preoxygenation: Pre-oxygenation with 100% oxygen Induction Type: IV induction Ventilation: Mask ventilation without difficulty Laryngoscope Size: 3 and Glidescope Grade View: Grade I Tube type: Oral Tube size: 7.0 mm Number of attempts: 1 Airway Equipment and Method: Stylet and Oral airway Placement Confirmation: ETT inserted through vocal cords under direct vision, positive ETCO2 and breath sounds checked- equal and bilateral Secured at: 23 cm Tube secured with: Tape Dental Injury: Teeth and Oropharynx as per pre-operative assessment

## 2024-08-19 NOTE — Transfer of Care (Signed)
 Immediate Anesthesia Transfer of Care Note  Patient: Andre Donovan  Procedure(s) Performed: THORACIC THREE-THORACIC SEVEN FUSION WITH PERCUTANEOUS PEDICLE SCREWS AND THORACIC FIVE TUMOR RESECTION LUMBAR PERCUTANEOUS PEDICLE SCREW 4 LEVEL  Patient Location: PACU  Anesthesia Type:General  Level of Consciousness: awake, alert , and oriented  Airway & Oxygen Therapy: Patient Spontanous Breathing and Patient connected to nasal cannula oxygen  Post-op Assessment: Report given to RN and Post -op Vital signs reviewed and stable  Post vital signs: Reviewed and stable  Last Vitals:  Vitals Value Taken Time  BP 116/72 08/19/24 13:49  Temp    Pulse 78 08/19/24 13:52  Resp 12 08/19/24 13:52  SpO2 97 % 08/19/24 13:52  Vitals shown include unfiled device data.  Last Pain:  Vitals:   08/19/24 0709  TempSrc:   PainSc: 0-No pain         Complications: No notable events documented.

## 2024-08-19 NOTE — Anesthesia Preprocedure Evaluation (Addendum)
"                                    Anesthesia Evaluation  Patient identified by MRN, date of birth, ID band Patient awake    Reviewed: Allergy & Precautions, NPO status , Patient's Chart, lab work & pertinent test results  History of Anesthesia Complications Negative for: history of anesthetic complications  Airway Mallampati: III  TM Distance: <3 FB Neck ROM: Full    Dental  (+) Teeth Intact, Dental Advisory Given   Pulmonary Current Smoker   breath sounds clear to auscultation       Cardiovascular  Rhythm:Regular   EKG: NSR    Neuro/Psych    GI/Hepatic   Endo/Other    Renal/GU   Renal Cell Carcinoma with Metastatic Disease      Musculoskeletal  CT Scan IMPRESSION: 1. Lytic destructive mass involving the posterior aspect of the T5 vertebral body, with extension into the posterior elements of T4 through T6, consistent with metastatic disease. Epidural extension with secondary severe spinal stenosis and cord compression, better appreciated on prior MRI. Associated severe bilateral foraminal narrowing at T5-6, worse on the right. 2. Multiple additional osseous metastases involving the C7, T2, and T6 vertebral bodies. Previously identified lesion at T8 better seen on prior MRI. Associated mild pathologic compression fracture at T2.    Abdominal   Peds  Hematology  Hgb 17.0, Plts 176K (07/2024)    Anesthesia Other Findings   Reproductive/Obstetrics                              Anesthesia Physical Anesthesia Plan  ASA: 3  Anesthesia Plan: General   Post-op Pain Management:    Induction: Intravenous  PONV Risk Score and Plan: 1 and Ondansetron , Dexamethasone  and Treatment may vary due to age or medical condition  Airway Management Planned: Oral ETT and Video Laryngoscope Planned  Additional Equipment: Arterial line  Intra-op Plan:   Post-operative Plan: Extubation in OR  Informed Consent: I have reviewed  the patients History and Physical, chart, labs and discussed the procedure including the risks, benefits and alternatives for the proposed anesthesia with the patient or authorized representative who has indicated his/her understanding and acceptance.     Dental advisory given  Plan Discussed with: CRNA  Anesthesia Plan Comments:          Anesthesia Quick Evaluation  "

## 2024-08-20 ENCOUNTER — Encounter (HOSPITAL_COMMUNITY): Payer: Self-pay | Admitting: Neurosurgery

## 2024-08-20 LAB — BASIC METABOLIC PANEL WITH GFR
Anion gap: 10 (ref 5–15)
BUN: 19 mg/dL (ref 6–20)
CO2: 25 mmol/L (ref 22–32)
Calcium: 8.9 mg/dL (ref 8.9–10.3)
Chloride: 100 mmol/L (ref 98–111)
Creatinine, Ser: 0.85 mg/dL (ref 0.61–1.24)
GFR, Estimated: >60 mL/min
Glucose, Bld: 133 mg/dL — ABNORMAL HIGH (ref 70–99)
Potassium: 5.3 mmol/L — ABNORMAL HIGH (ref 3.5–5.1)
Sodium: 135 mmol/L (ref 135–145)

## 2024-08-20 LAB — CBC
HCT: 41.3 % (ref 39.0–52.0)
Hemoglobin: 14.1 g/dL (ref 13.0–17.0)
MCH: 31.5 pg (ref 26.0–34.0)
MCHC: 34.1 g/dL (ref 30.0–36.0)
MCV: 92.4 fL (ref 80.0–100.0)
Platelets: 175 10*3/uL (ref 150–400)
RBC: 4.47 MIL/uL (ref 4.22–5.81)
RDW: 12.7 % (ref 11.5–15.5)
WBC: 15.3 10*3/uL — ABNORMAL HIGH (ref 4.0–10.5)
nRBC: 0 % (ref 0.0–0.2)

## 2024-08-20 LAB — POTASSIUM: Potassium: 4.6 mmol/L (ref 3.5–5.1)

## 2024-08-20 MED ORDER — DEXAMETHASONE SODIUM PHOSPHATE 4 MG/ML IJ SOLN
4.0000 mg | Freq: Four times a day (QID) | INTRAMUSCULAR | Status: DC
  Administered 2024-08-20 – 2024-08-22 (×7): 4 mg via INTRAVENOUS
  Filled 2024-08-20 (×7): qty 1

## 2024-08-20 MED FILL — Thrombin For Soln 5000 Unit: CUTANEOUS | Qty: 5000 | Status: AC

## 2024-08-20 NOTE — Progress Notes (Addendum)
 " PROGRESS NOTE    Andre Donovan  FMW:[id number] DOB: 18-Jul-1966 DOA: 08/16/2024 PCP: Orpha Yancey LABOR, MD   Brief Narrative: 59 year old with past medical history significant for renal cell carcinoma diagnosed October 2024 after he had presented to the hospital with pneumonia, CT scan chest incidentally showed right kidney mass subsequently underwent MRI that showed right renal mass.  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  Since then patient has been on surveillance by oncology. He reports numbness and tingling bilateral leg since Christmas gait problems, back pain.  Oncologist subsequently ordered a PET scan on 08/02/2024.  PET scan with finding of multifocal osseous lesions concerning for metastatic disease, concerning for eminent cord compression, patient was referred to the emergency department for further evaluation. MRI thoracic spine with and without contrast showed x-ray versus extension of vertebral metastatic disease at T5 resulting in severe spinal canal stenosis and spinal cord compression with edema.  Severe right T5-T6 foraminal narrowing.  Multifocal osseous metastatic disease throughout the spine including C6, T2, T6, T7, T8, T10. Case was discussed with neurosurgeon on-call Dr. Gillie and recommendation was to transfer to [facility] for further evaluation.   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 Patient underwent MRI spine which raised concern for severe spinal canal stenosis and cord compression.   Neurosurgery was consulted.  Patient underwent decompression surgery on 08/19/2024 Patient is on dexamethasone  6 mg every 6 hours. Patient also seen by radiation oncology with plans for radiation treatment after he has recovered from  surgery. PT OT evaluation. Discharge when cleared by neurosurgery. Leukocytosis likely due to steroids.  Elevated potassium level noted.  Will remove potassium from his IV fluids.  Recheck labs this afternoon.  Metastatic renal cell carcinoma Patient's oncologist, Dr. Davonna, was notified of patient's hospitalization. See above.    Constipation Continue with MiraLAX  and docusate   DVT prophylaxis: Lovenox  Code Status: Full code Family Communication: Care discussed with patient Disposition Plan: Home with home health is recommended   Consultants:  Dr. Gillie Dr. Estefana Cha  radiation oncologist  Procedures:  none  Subjective: Patient mentions that he is having some pain in the back at the surgical site.  Able to move his legs.  No other complaints offered.  No chest pain shortness of breath.  No nausea vomiting.  Urinating well.    Objective: Vitals:   08/19/24 2019 08/19/24 2019 08/19/24 2359 08/20/24 0502  BP: 115/65 115/65 122/73 124/73  Pulse:  79 76 72  Resp: 18  16 14   Temp: 97.6 F (36.4 C) 97.6 F (36.4 C) 97.8 F (36.6 C) 98 F (36.7 C)  TempSrc: Oral Oral Oral Oral  SpO2: 98%  97% 98%  Weight:      Height:        Intake/Output Summary (Last 24 hours) at 08/20/2024 0923 Last data filed at 08/20/2024 0509 Gross per 24 hour  Intake 2100 ml  Output 3260 ml  Net -1160 ml    Filed Weights   08/16/24 1235 08/19/24 0703  Weight: 96.6 kg (P) 97.5 kg    Examination:  General appearance: Awake alert.  In no distress Resp: Clear to auscultation bilaterally.  Normal effort Cardio: S1-S2 is normal regular.  No S3-S4.  No rubs murmurs or bruit GI: Abdomen is soft.  Nontender  nondistended.  Bowel sounds are present normal.  No masses organomegaly   Data Reviewed: I have personally reviewed following labs and reports of imaging studies  CBC: Recent Labs  Lab 08/16/24 1316 08/17/24 0532 08/19/24 1236 08/20/24 0202  WBC 10.9* 5.6  --  15.3*   NEUTROABS 8.3*  --   --   --   HGB 16.3 17.0 15.0 14.1  HCT 48.1 48.8 44.0 41.3  MCV 93.2 91.4  --  92.4  PLT 186 156  --  175   Basic Metabolic Panel: Recent Labs  Lab 08/16/24 1316 08/17/24 0532 08/19/24 1236 08/20/24 0202  NA 140 135 138 135  K 4.0 4.6 4.9 5.3*  CL 104 101  --  100  CO2 22 23  --  25  GLUCOSE 90 137*  --  133*  BUN 17 18  --  19  CREATININE 0.82 0.86  --  0.85  CALCIUM 9.4 9.8  --  8.9   GFR: Estimated Creatinine Clearance: 111.4 mL/min (by C-G formula based on SCr of 0.85 mg/dL).   Recent Results (from the past 240 hours)  Surgical pcr screen     Status: None   Collection Time: 08/17/24 10:39 PM   Specimen: Nasal Mucosa; Nasal Swab  Result Value Ref Range Status   MRSA, PCR NEGATIVE NEGATIVE Final   Staphylococcus aureus NEGATIVE NEGATIVE Final    Comment: (NOTE) The Xpert SA Assay (FDA approved for NASAL specimens in patients 37 years of age and older), is one component of a comprehensive surveillance program. It is not intended to diagnose infection nor to guide or monitor treatment. Performed at [facility], [address], [place], [place] [address]       Radiology Studies: DG Thoracic Spine 2 View Result Date: 08/19/2024 EXAM: FLUOROSCOPIC IMAGING TECHNIQUE: Fluoroscopy was provided by the radiology department for procedure. Radiologist was not present during examination. RADIATION DOSE INDEX: Reference Air Kerma: 133.8 mGy. Fluoroscopy time: 253 seconds. COMPARISON: None available. CLINICAL HISTORY: Thoracic fusion and T5 tumor resection. FINDINGS: Intraoperative fluoroscopic imaging was performed. AP and lateral views of the thoracic spine show pedicle screws at T3, T4, T6, and T7. Posterior fixation is noted. IMPRESSION: 1. Intraoperative fluoroscopic imaging as above. Please refer to the operative report for full details. Electronically signed by: Oneil Devonshire MD 08/19/2024 09:04 PM EST RP Workstation: [id number]   DG Thoracic  Spine 1 View Result Date: 08/19/2024 EXAM: 1 LATERAL VIEW XRAY OF THE THORACIC SPINE 08/19/2024 11:52:00 AM COMPARISON: None available. CLINICAL HISTORY: Intraoperative localization. FINDINGS: BONES: Vertebral body heights are maintained. Alignment is normal. The needle is noted overlying what appears to be the T3 vertebral body. Correlate with the intraoperative findings. DISCS AND DEGENERATIVE CHANGES: No severe degenerative changes. SOFT TISSUES: The visualized lungs are clear. IMPRESSION: 1. Needle overlies the T3 vertebral body for intraoperative localization. Electronically signed by: Oneil Devonshire MD 08/19/2024 09:02 PM EST RP Workstation: [id number] C-Arm 1-60 Min-No Report Result Date: 08/19/2024 Fluoroscopy was utilized by the requesting physician.  No radiographic interpretation.   DG C-Arm 1-60 Min-No Report Result Date: 08/19/2024 Fluoroscopy was utilized by the requesting physician.  No radiographic interpretation.   DG C-Arm 1-60 Min-No Report Result Date: 08/19/2024 Fluoroscopy was utilized by the requesting physician.  No radiographic interpretation.   DG C-Arm 1-60 Min-No Report Result Date: 08/19/2024 Fluoroscopy was utilized by the requesting physician.  No radiographic interpretation.   DG C-Arm 1-60 Min-No Report Result Date: 08/19/2024 Fluoroscopy was utilized  by the requesting physician.  No radiographic interpretation.   DG C-Arm 1-60 Min-No Report Result Date: 08/19/2024 Fluoroscopy was utilized by the requesting physician.  No radiographic interpretation.     Scheduled Meds:  dexamethasone  (DECADRON ) injection  6 mg Intravenous Q6H   docusate sodium   100 mg Oral BID   pantoprazole   40 mg Oral Daily   polyethylene glycol  17 g Oral Daily   senna  1 tablet Oral BID   sodium chloride  flush  3 mL Intravenous Q12H   Continuous Infusions:  sodium chloride      sodium chloride        LOS: 4 days    Joette Pebbles, MD [facility]   If 7PM-7AM,  please contact night-coverage [email or web address]  08/20/2024, 9:23 AM   "

## 2024-08-20 NOTE — Op Note (Signed)
 08/19/2024  5:17 PM  PATIENT:  Andre Donovan  59 y.o. male With likely metastatic disease to the spine, most importantly with spinal cord compression at T5, and a pathologic fracture of T5. Taken to the operating room for an ORIF of the T5 fracture, and tumor resection with decompression of the spinal canal and spinal cord.  PRE-OPERATIVE DIAGNOSIS:  large compressive mass at T5 Pathologic T5 fracture, spinal instability POST-OPERATIVE DIAGNOSIS:  same  PROCEDURE:  Open reduction internal fixation Thoracic five pathological fracture.  Segmental pedicle screw fixation T3-T7 Extradural tumor resection Thoracic five  SURGEON: Surgeon(s): Andre Duncans, MD  ASSISTANTS:none  ANESTHESIA:   general  EBL:  Total I/O In: -  Out: 375 [Urine:375]  BLOOD ADMINISTERED:none  CELL SAVER GIVEN:not used  COUNT:per nursing  DRAINS: (large) Hemovact drain(s) in the subfascial space with  Suction Open and Urinary Catheter (Foley)   SPECIMEN:  Source of Specimen:  epidural space around T5, and T5 vertebral body  DICTATION: Andre Donovan was taken to the operating room, intubated, and placed under a general anesthetic without difficulty. A foley catheter was placed under sterile conditions. He was positioned prone on the Melville table with all pressure points properly padded. His thoracic region was prepped and draped in a sterile manner.  I, using fluoroscopy placed pedicle two screws(Nuvasive} at T3, T4, and T7. One screw was placed at T6 on the left due to tumor infiltration in the right T6 pedicle. All screws were placed in the following fashion.  With fluoroscopy I identified entry points for each pedicle. I infiltrated the skin and subcutaneous tissue with lidocaine  in my planned trajectory through the pedicle. I opened the skin with a 10 blade. I placed a Jamshidi needle on the bone to enter the pedicle. I did all this in the AP plane. I used a mallet to traverse the pedicle. I then used a  lateral view to ensure the pedicle was not breeched. I placed a Kwire through the needle and checked its location with fluoroscopy. I placed the screw by placing it over the wire and through the pedicle into the vertebral body.  I placed a rod thorough the towers and tulip heads. I placed locking caps on each screw securing the rod to the screw heads. I removed the towers and closed the 7 incisions.  I resected the mass by making a midline incision overlying the T5 mass. I exposed the lamina bilaterally at T6 and T4.I placed a retractor and appreciated abnormal tissue which was reddish gray, and vascular enveloping and surrounding the dura. The tissue was easily removed with suction. I identified the dura and removed as much of the tumor as I could. I controlled bleeding with hemostatic agents. When I was satisfied with the decompression I achieved hemostasis. I placed a hemovac drain into the subfascial space. I closed the thoracolumbar fascia with sutures, the subcutaneous, and subcuticular planes with sutures. I sutured the hemovac drain at its exit site to secure it.   A sterile dressing was placed over all the incisions. He was rolled supine onto the OR stretcher, extubated moving all extremities.  PLAN OF CARE: Admit to inpatient   PATIENT DISPOSITION:  PACU - hemodynamically stable.   Delay start of Pharmacological VTE agent (>24hrs) due to surgical blood loss or risk of bleeding:  yes

## 2024-08-20 NOTE — Progress Notes (Signed)
 Patient ID: Andre Donovan, male   DOB: Sep 17, 1965, 59 y.o.   MRN: [id number] BP 135/74 (BP Location: Right Arm)   Pulse 71   Temp 98.3 F (36.8 C) (Oral)   Resp 17   Ht (P) 6' (1.829 m)   Wt (P) 97.5 kg   SpO2 99%   BMI (P) 29.16 kg/m  Alert and oriented x 4. Speech is clear and fluent. Moving all extremities well.  Will leave drain in place. Wound dressing intact.  Worked with PT today, has been up. Voiding, and with bowel movements. Doing very well post op. No final word from pathology.

## 2024-08-20 NOTE — Care Management (Signed)
 Transition of Care [facility]) - Inpatient Brief Assessment   Patient Details  Name: Andre Donovan MRN: [id number] Date of Birth: 02/28/1966  [facility] CM/SW Contact:    Andre JAYSON Canary, RN Phone Number: 08/20/2024, 9:46 AM   Clinical Narrative: Patient  presented with  progressive weakness, surgical intervention T3-T7 fusion and tumor resection,  Post operative doing well. He lives with his wife. Spoke to Andre Donovan. Discussed DME , HH discharge planning . Andre Donovan has been ordered and due to him not having insurance, they will call her with price and deliver.. She declines home health at this time, and feels she can care for him on her own. Reach out to PT to give her instructions and exercises to do. No further needs identified.   Transition of Care Asessment: Insurance and Status: Selfpay Patient has primary care physician: Yes Home environment has been reviewed: Lives with spouse Prior level of function:: Independent Prior/Current Home Services: No current home services Social Drivers of Health Review: SDOH reviewed interventions complete   Transition of care needs: transition of care needs identified, TOC will continue to follow

## 2024-08-20 NOTE — Plan of Care (Signed)
  Problem: Education: Goal: Knowledge of General Education information will improve Description: Including pain rating scale, medication(s)/side effects and non-pharmacologic comfort measures Outcome: Progressing   Problem: Clinical Measurements: Goal: Ability to maintain clinical measurements within normal limits will improve Outcome: Progressing Goal: Will remain free from infection Outcome: Progressing Goal: Respiratory complications will improve Outcome: Progressing Goal: Cardiovascular complication will be avoided Outcome: Progressing   Problem: Coping: Goal: Level of anxiety will decrease Outcome: Progressing   Problem: Pain Managment: Goal: General experience of comfort will improve and/or be controlled Outcome: Progressing

## 2024-08-20 NOTE — Evaluation (Signed)
 Physical Therapy Evaluation Patient Details Name: Andre Donovan MRN: [id number] DOB: 07-27-1965 Today's Date: 08/20/2024  History of Present Illness  Pt is a 59 y.o male presenting to [facility] 1/26 after Oncology MD recommended ED visit after PET scan showed T5 lesion. 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 Transferred to [facility] for neurosurgery consult. S/p T3-T7 fusion with screws and T5 tumor resection 1/29. PMH: kidney clear-cell renal carcinoma, pneumonia.   Clinical Impression  Pt admitted with above diagnosis. PTA, pt was independent with functional mobility, ADLs, and driving. He notes increased difficulty and taking increased time to complete tasks as his back symptoms progressed. Pt lives with his wife in a one story house with 2 STE. Pt currently with functional limitations due to the deficits listed below (see PT Problem List). He required CGA for bed mobility, SBA for transfers/gait using RW, and CGA for stairs. Pt is currently limited by stiffness, back precautions, BLE numbness, reduced strength (R>L), and decreased balance. Educated pt/family on back precautions and provided them with handout. He maintained a neutral back posture throughout mobility. Pt ambulated greater than a household distance without LOB. Pt will benefit from acute skilled PT to increase his independence and safety with mobility to allow discharge. Recommend HHPT to increase strength, improve balance, decrease fall risk, and optimize safety within the home environment.      If plan is discharge home, recommend the following: A little help with walking and/or transfers;A little help with bathing/dressing/bathroom;Assistance with cooking/housework;Assist for transportation;Help with stairs or ramp for entrance   Can travel by private vehicle        Equipment Recommendations Rolling walker (2 wheels)  Recommendations for Other  Services       Functional Status Assessment Patient has had a recent decline in their functional status and demonstrates the ability to make significant improvements in function in a reasonable and predictable amount of time.     Precautions / Restrictions Precautions Precautions: Fall;Back Precaution Booklet Issued: Yes (comment) Recall of Precautions/Restrictions: Intact Precaution/Restrictions Comments: Hemovac drain; No brace needed Restrictions Weight Bearing Restrictions Per Provider Order: No      Mobility  Bed Mobility Overal bed mobility: Needs Assistance Bed Mobility: Rolling, Sidelying to Sit Rolling: Contact guard assist, Used rails Sidelying to sit: Contact guard assist, Used rails       General bed mobility comments: Educated pt on log roll technique. Cued sequencing. Light assist to reach sidelying. Pt brought BLE off EOB. Light assist to elevate trunk. Pt maintained neutral back positioning throughout.    Transfers Overall transfer level: Needs assistance Equipment used: Rolling walker (2 wheels) Transfers: Sit to/from Stand, Bed to chair/wheelchair/BSC Sit to Stand: Contact guard assist, Supervision   Step pivot transfers: Contact guard assist, Supervision       General transfer comment: Introduced RW and educated pt on proper and safe use of AD. Cued proper hand placement using RW. Powered up with light assist. Transferred to recliner chair. Cued pt to back up until BLE were in contact with the chair and to reach back for arm rest. Good eccentric control. Pt maintained neutral back positioning throughout.    Ambulation/Gait Ambulation/Gait assistance: Contact guard assist, Supervision Gait Distance (Feet): 150 Feet Assistive device: Rolling walker (2 wheels) Gait Pattern/deviations: Step-through pattern, Decreased stride length Gait velocity: reduced Gait velocity interpretation: <1.8 ft/sec, indicate of risk for recurrent falls   General Gait  Details: Pt ambulated with  a reciprocal gait pattern, slightly reduced step length. He maintained neutral back positioning, upright posture, and good proximity to RW. Pt navigated room, hallway, and gym well. No LOB.  Stairs Stairs: Yes Stairs assistance: Contact guard assist Stair Management: One rail Right, Forwards, Step to pattern Number of Stairs: 2 General stair comments: Pt ascended/descended 2 standard height steps one at a time. He reports a post at home that he can hold onto; therefore, used R rail. PT provided HHA to LUE. Pt held onto PT's shoulders while descending and reported difficulty with going down. Discussed how his family should be positioned to support him.  Wheelchair Mobility     Tilt Bed    Modified Rankin (Stroke Patients Only)       Balance Overall balance assessment: Needs assistance Sitting-balance support: No upper extremity supported, Feet supported Sitting balance-Leahy Scale: Fair Sitting balance - Comments: Sat EOB   Standing balance support: Bilateral upper extremity supported, During functional activity, Reliant on assistive device for balance Standing balance-Leahy Scale: Poor Standing balance comment: Pt dependent on RW                             Pertinent Vitals/Pain Pain Assessment Pain Assessment: No/denies pain (Pt c/o stiffness)    Home Living Family/patient expects to be discharged to:: Private residence Living Arrangements: Spouse/significant other Available Help at Discharge: Family;Available PRN/intermittently (Nearly 24/7 support, Wife works cleaning houses and will be gone for a few hours at a time.) Type of Home: House Home Access: Stairs to enter Entrance Stairs-Rails: None Secretary/administrator of Steps: 2   Home Layout: One level Home Equipment: Cane - single point;Hand held shower head      Prior Function Prior Level of Function : Independent/Modified Independent;Driving;Working/employed              Mobility Comments: Ambulates without AD. 1 fall d/t stepping on something. ADLs Comments: Indep with ADLs. Drives. Pt reports barely working, duing very light activity d/t the numbness, pain, and weakness. Wife assist with medications and household management.     Extremity/Trunk Assessment   Upper Extremity Assessment Upper Extremity Assessment: Defer to OT evaluation    Lower Extremity Assessment Lower Extremity Assessment: RLE deficits/detail;LLE deficits/detail RLE Deficits / Details: AROM WFL. Strength grossly 4-/5 RLE Sensation: decreased light touch (Pt c/o numbness throughout the entire leg.) RLE Coordination: WNL LLE Deficits / Details: AROM WFL. Strength grossly 4/5 LLE Sensation: decreased light touch (Pt c/o numbness throughout the entire leg.) LLE Coordination: WNL    Cervical / Trunk Assessment Cervical / Trunk Assessment: Back Surgery  Communication   Communication Communication: No apparent difficulties    Cognition Arousal: Alert Behavior During Therapy: WFL for tasks assessed/performed   PT - Cognitive impairments: No apparent impairments                       PT - Cognition Comments: Pt A,Ox4 Following commands: Intact       Cueing Cueing Techniques: Verbal cues     General Comments General comments (skin integrity, edema, etc.): VSS on RA. Some drainage on dressing covering thoracic region. Hemovac drain intact. Wife and Sister present and supportive throughout session.    Exercises     Assessment/Plan    PT Assessment Patient needs continued PT services  PT Problem List Decreased strength;Decreased mobility;Decreased balance;Decreased knowledge of use of DME;Decreased safety awareness;Decreased knowledge of precautions  PT Treatment Interventions DME instruction;Gait training;Stair training;Functional mobility training;Therapeutic activities;Therapeutic exercise;Balance training;Patient/family education    PT Goals (Current  goals can be found in the Care Plan section)  Acute Rehab PT Goals Patient Stated Goal: Regain independence and return home PT Goal Formulation: With patient/family Time For Goal Achievement: 09/03/24 Potential to Achieve Goals: Good    Frequency Min 2X/week     Co-evaluation               AM-PAC PT 6 Clicks Mobility  Outcome Measure Help needed turning from your back to your side while in a flat bed without using bedrails?: A Little Help needed moving from lying on your back to sitting on the side of a flat bed without using bedrails?: A Little Help needed moving to and from a bed to a chair (including a wheelchair)?: A Little Help needed standing up from a chair using your arms (e.g., wheelchair or bedside chair)?: A Little Help needed to walk in hospital room?: A Little Help needed climbing 3-5 steps with a railing? : A Little 6 Click Score: 18    End of Session Equipment Utilized During Treatment: Gait belt Activity Tolerance: Patient tolerated treatment well Patient left: in chair;with call bell/phone within reach;with chair alarm set Nurse Communication: Mobility status PT Visit Diagnosis: Difficulty in walking, not elsewhere classified (R26.2);Other abnormalities of gait and mobility (R26.89)    Time: 9253-9185 PT Time Calculation (min) (ACUTE ONLY): 28 min   Charges:   PT Evaluation $PT Eval Moderate Complexity: 1 Mod PT Treatments $Gait Training: 8-22 mins PT General Charges $$ ACUTE PT VISIT: 1 Visit         Randall SAUNDERS, PT, DPT Acute Rehabilitation Services Office: [phone] Secure Chat Preferred   Andre Donovan 08/20/2024, 8:34 AM

## 2024-08-20 NOTE — Evaluation (Signed)
 Occupational Therapy Evaluation Patient Details Name: Andre Donovan MRN: [id number] DOB: Feb 02, 1966 Today's Date: 08/20/2024   History of Present Illness   Pt is a 59 y.o male presenting to [facility] 1/26 after Oncology MD recommended ED visit after PET scan showed T5 lesion. 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 Transferred to [facility] for neurosurgery consult. S/p T3-T7 fusion with screws and T5 tumor resection 1/29. PMH: kidney clear-cell renal carcinoma, pneumonia     Clinical Impressions Pt admitted based on above, and was seen based on problem list below. PTA pt was independent with ADLs and IADLs. Today pt is supervision for ADLs with use of compensatory strategies. Provided and reviewed back educational handout with pt and family pt able to verbalize and demo return understanding. Educated pt on use of shower chair at d/c to assist with LB bathing, family verbalized they will self-purchase. Pt would benefit from additional OT session to reinforce education and promote skill carryover for d/c, no follow up OT needs.     If plan is discharge home, recommend the following:   A little help with bathing/dressing/bathroom;A little help with walking and/or transfers;Assistance with cooking/housework;Assist for transportation;Help with stairs or ramp for entrance     Functional Status Assessment   Patient has had a recent decline in their functional status and demonstrates the ability to make significant improvements in function in a reasonable and predictable amount of time.     Equipment Recommendations   Tub/shower seat      Precautions/Restrictions   Precautions Precautions: Fall;Back Precaution Booklet Issued: Yes (comment) Recall of Precautions/Restrictions: Intact Precaution/Restrictions Comments: Hemovac drain; No brace needed Restrictions Weight Bearing Restrictions Per Provider Order:  No     Mobility Bed Mobility               General bed mobility comments: Pt received in recliner    Transfers Overall transfer level: Needs assistance Equipment used: Rolling walker (2 wheels) Transfers: Sit to/from Stand Sit to Stand: Supervision           General transfer comment: S for safety, good hand placement with RW      Balance Overall balance assessment: Needs assistance Sitting-balance support: No upper extremity supported, Feet supported Sitting balance-Leahy Scale: Fair Sitting balance - Comments: Sat EOB   Standing balance support: Bilateral upper extremity supported, During functional activity, Reliant on assistive device for balance Standing balance-Leahy Scale: Poor Standing balance comment: Pt dependent on RW       ADL either performed or assessed with clinical judgement   ADL Overall ADL's : Needs assistance/impaired Eating/Feeding: Set up;Sitting   Grooming: Supervision/safety;Standing;Cueing for compensatory techniques           Upper Body Dressing : Set up;Sitting   Lower Body Dressing: Supervision/safety;Sit to/from stand;Cueing for compensatory techniques Lower Body Dressing Details (indicate cue type and reason): able to figure 4 BLEs Toilet Transfer: Supervision/safety;Regular Toilet;Ambulation;Rolling walker (2 wheels)   Toileting- Clothing Manipulation and Hygiene: Supervision/safety;Sit to/from stand       Functional mobility during ADLs: Supervision/safety;Rolling walker (2 wheels) General ADL Comments: Reviewed use of compensatory stratgies for ADLs     Vision Baseline Vision/History: 0 No visual deficits Patient Visual Report: No change from baseline Vision Assessment?: No apparent visual deficits            Pertinent Vitals/Pain Pain Assessment Pain Assessment: No/denies pain     Extremity/Trunk Assessment Upper Extremity Assessment Upper Extremity  Assessment: Overall WFL for tasks assessed   Lower  Extremity Assessment Lower Extremity Assessment: Defer to PT evaluation RLE Deficits / Details: AROM WFL. Strength grossly 4-/5 RLE Sensation: decreased light touch (Pt c/o numbness throughout the entire leg.) RLE Coordination: WNL LLE Deficits / Details: AROM WFL. Strength grossly 4/5 LLE Sensation: decreased light touch (Pt c/o numbness throughout the entire leg.) LLE Coordination: WNL   Cervical / Trunk Assessment Cervical / Trunk Assessment: Back Surgery   Communication Communication Communication: No apparent difficulties   Cognition Arousal: Alert Behavior During Therapy: WFL for tasks assessed/performed Cognition: No apparent impairments       Following commands: Intact       Cueing  General Comments   Cueing Techniques: Verbal cues  Family present for education and supportive           Home Living Family/patient expects to be discharged to:: Private residence Living Arrangements: Spouse/significant other Available Help at Discharge: Family;Available PRN/intermittently (Nearly 24/7 support, Wife works cleaning houses and will be gone for a few hours at a time.) Type of Home: House Home Access: Stairs to enter Secretary/administrator of Steps: 2 Entrance Stairs-Rails: None Home Layout: One level     Bathroom Shower/Tub: Chief Strategy Officer: Handicapped height Bathroom Accessibility: No   Home Equipment: Cane - single point;Hand held shower head          Prior Functioning/Environment Prior Level of Function : Independent/Modified Independent;Driving;Working/employed             Mobility Comments: Ambulates without AD. 1 fall d/t stepping on something. ADLs Comments: Indep with ADLs. Drives. Pt reports barely working, duing very light activity d/t the numbness, pain, and weakness. Wife assist with medications and household management.    OT Problem List: Pain;Decreased strength;Decreased knowledge of precautions;Decreased knowledge  of use of DME or AE   OT Treatment/Interventions: Self-care/ADL training;Therapeutic exercise;Energy conservation;DME and/or AE instruction;Therapeutic activities;Patient/family education;Balance training      OT Goals(Current goals can be found in the care plan section)   Acute Rehab OT Goals Patient Stated Goal: To go home OT Goal Formulation: With patient Time For Goal Achievement: 09/03/24 Potential to Achieve Goals: Good   OT Frequency:  Min 2X/week       AM-PAC OT 6 Clicks Daily Activity     Outcome Measure Help from another person eating meals?: None Help from another person taking care of personal grooming?: A Little Help from another person toileting, which includes using toliet, bedpan, or urinal?: A Little Help from another person bathing (including washing, rinsing, drying)?: A Little Help from another person to put on and taking off regular upper body clothing?: A Little Help from another person to put on and taking off regular lower body clothing?: A Little 6 Click Score: 19   End of Session Equipment Utilized During Treatment: Rolling walker (2 wheels) Nurse Communication: Mobility status  Activity Tolerance: Patient tolerated treatment well Patient left: in chair;with call bell/phone within reach;with chair alarm set  OT Visit Diagnosis: Unsteadiness on feet (R26.81);Other abnormalities of gait and mobility (R26.89);Muscle weakness (generalized) (M62.81);Pain Pain - part of body:  (back)                Time: 9146-9089 OT Time Calculation (min): 17 min Charges:  OT General Charges $OT Visit: 1 Visit OT Evaluation $OT Eval Low Complexity: 1 Low  Adrianne BROCKS, OT  Acute Rehabilitation Services Office [phone] Secure chat preferred   Adrianne GORMAN Savers 08/20/2024, 10:08 AM

## 2024-08-20 NOTE — Plan of Care (Signed)
 Radiation planning scan scheduled for 08/27/24 with tentative plan to begin radiation 09/06/24. Plan for 30 Gy in 10 fractions to the T spine and possibly to L iliac if patient symptomatic.

## 2024-08-21 LAB — BASIC METABOLIC PANEL WITH GFR
Anion gap: 7 (ref 5–15)
BUN: 18 mg/dL (ref 6–20)
CO2: 27 mmol/L (ref 22–32)
Calcium: 9.1 mg/dL (ref 8.9–10.3)
Chloride: 101 mmol/L (ref 98–111)
Creatinine, Ser: 0.65 mg/dL (ref 0.61–1.24)
GFR, Estimated: >60 mL/min
Glucose, Bld: 140 mg/dL — ABNORMAL HIGH (ref 70–99)
Potassium: 4.5 mmol/L (ref 3.5–5.1)
Sodium: 135 mmol/L (ref 135–145)

## 2024-08-21 LAB — CBC
HCT: 42.6 % (ref 39.0–52.0)
Hemoglobin: 15 g/dL (ref 13.0–17.0)
MCH: 31.8 pg (ref 26.0–34.0)
MCHC: 35.2 g/dL (ref 30.0–36.0)
MCV: 90.3 fL (ref 80.0–100.0)
Platelets: 166 10*3/uL (ref 150–400)
RBC: 4.72 MIL/uL (ref 4.22–5.81)
RDW: 12.5 % (ref 11.5–15.5)
WBC: 14.7 10*3/uL — ABNORMAL HIGH (ref 4.0–10.5)
nRBC: 0 % (ref 0.0–0.2)

## 2024-08-21 MED ORDER — POLYETHYLENE GLYCOL 3350 17 G PO PACK
17.0000 g | PACK | Freq: Two times a day (BID) | ORAL | Status: DC
  Administered 2024-08-21 – 2024-08-22 (×4): 17 g via ORAL
  Filled 2024-08-21 (×4): qty 1

## 2024-08-21 NOTE — Progress Notes (Signed)
 " PROGRESS NOTE    Andre Donovan  FMW:[id number] DOB: 1966-06-28 DOA: 08/16/2024 PCP: Orpha Yancey LABOR, MD   Brief Narrative: 59 year old with past medical history significant for renal cell carcinoma diagnosed October 2024 after he had presented to the hospital with pneumonia, CT scan chest incidentally showed right kidney mass subsequently underwent MRI that showed right renal mass.  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  Since then patient has been on surveillance by oncology. He reports numbness and tingling bilateral leg since Christmas gait problems, back pain.  Oncologist subsequently ordered a PET scan on 08/02/2024.  PET scan with finding of multifocal osseous lesions concerning for metastatic disease, concerning for eminent cord compression, patient was referred to the emergency department for further evaluation. MRI thoracic spine with and without contrast showed x-ray versus extension of vertebral metastatic disease at T5 resulting in severe spinal canal stenosis and spinal cord compression with edema.  Severe right T5-T6 foraminal narrowing.  Multifocal osseous metastatic disease throughout the spine including C6, T2, T6, T7, T8, T10. Case was discussed with neurosurgeon on-call Dr. Gillie and recommendation was to transfer to [facility] for further evaluation.   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 Patient underwent MRI spine which raised concern for severe spinal canal stenosis and cord compression.   Neurosurgery was consulted.  Patient underwent decompression surgery on 08/19/2024 Patient is on dexamethasone  6 mg every 6 hours. Patient also seen by radiation oncology with plans for radiation treatment after he has recovered from surgery.   Simulation on 08/27/2024 with plans to begin radiation on 09/06/2024. PT OT evaluation. Discharge when cleared by neurosurgery. Leukocytosis likely due to steroids.    Metastatic renal cell carcinoma Patient's oncologist, Dr. Davonna, was notified of patient's hospitalization. See above.    Constipation Continue with MiraLAX  and docusate.  No BMs been charted.  Patient mentioned that he had a small 1 yesterday and feels like he will have 1 today.  May need to increase the dose of his laxatives.   DVT prophylaxis: Lovenox  Code Status: Full code Family Communication: Care discussed with patient Disposition Plan: Home with home health is recommended   Consultants:  Dr. Gillie Dr. Estefana Cha  radiation oncologist  Procedures:  none  Subjective: Patient feels well.  Back pain is reasonably well-controlled.  No new complaints offered.  Small BM yesterday.  May have 1 today.    Objective: Vitals:   08/20/24 1600 08/20/24 1900 08/20/24 2340 08/21/24 0351  BP: 135/74 127/81 133/73 130/81  Pulse: 71 78 67 64  Resp: 17 18 16 16   Temp: 98.3 F (36.8 C) 97.9 F (36.6 C) 97.7 F (36.5 C) 98.5 F (36.9 C)  TempSrc: Oral Oral Oral Oral  SpO2: 99% 97% 95% 98%  Weight:      Height:        Intake/Output Summary (Last 24 hours) at 08/21/2024 1014 Last data filed at 08/21/2024 0600 Gross per 24 hour  Intake --  Output 700 ml  Net -700 ml    Filed Weights   08/16/24 1235 08/19/24 0703  Weight: 96.6 kg (P) 97.5 kg    Examination:  General appearance: Awake alert.  In no distress Resp: Clear to auscultation bilaterally.  Normal effort Cardio: S1-S2 is normal regular.  No S3-S4.  No  rubs murmurs or bruit GI: Abdomen is soft.  Nontender nondistended.  Bowel sounds are present normal.  No masses organomegaly Moving extremities  Data Reviewed: I have personally reviewed following labs and reports of imaging studies  CBC: Recent Labs  Lab 08/16/24 1316 08/17/24 0532  08/19/24 1236 08/20/24 0202 08/21/24 0644  WBC 10.9* 5.6  --  15.3* 14.7*  NEUTROABS 8.3*  --   --   --   --   HGB 16.3 17.0 15.0 14.1 15.0  HCT 48.1 48.8 44.0 41.3 42.6  MCV 93.2 91.4  --  92.4 90.3  PLT 186 156  --  175 166   Basic Metabolic Panel: Recent Labs  Lab 08/16/24 1316 08/17/24 0532 08/19/24 1236 08/20/24 0202 08/20/24 1042 08/21/24 0644  NA 140 135 138 135  --  135  K 4.0 4.6 4.9 5.3* 4.6 4.5  CL 104 101  --  100  --  101  CO2 22 23  --  25  --  27  GLUCOSE 90 137*  --  133*  --  140*  BUN 17 18  --  19  --  18  CREATININE 0.82 0.86  --  0.85  --  0.65  CALCIUM 9.4 9.8  --  8.9  --  9.1   GFR: Estimated Creatinine Clearance: 118.3 mL/min (by C-G formula based on SCr of 0.65 mg/dL).   Recent Results (from the past 240 hours)  Surgical pcr screen     Status: None   Collection Time: 08/17/24 10:39 PM   Specimen: Nasal Mucosa; Nasal Swab  Result Value Ref Range Status   MRSA, PCR NEGATIVE NEGATIVE Final   Staphylococcus aureus NEGATIVE NEGATIVE Final    Comment: (NOTE) The Xpert SA Assay (FDA approved for NASAL specimens in patients 26 years of age and older), is one component of a comprehensive surveillance program. It is not intended to diagnose infection nor to guide or monitor treatment. Performed at [facility], [address] [place], [place] [address]       Radiology Studies: DG Thoracic Spine 2 View Result Date: 08/19/2024 EXAM: FLUOROSCOPIC IMAGING TECHNIQUE: Fluoroscopy was provided by the radiology department for procedure. Radiologist was not present during examination. RADIATION DOSE INDEX: Reference Air Kerma: 133.8 mGy. Fluoroscopy time: 253 seconds. COMPARISON: None available. CLINICAL HISTORY: Thoracic fusion and T5 tumor resection. FINDINGS: Intraoperative fluoroscopic imaging was performed. AP and lateral views of the thoracic spine show pedicle screws at T3, T4, T6, and T7. Posterior fixation is noted. IMPRESSION: 1.  Intraoperative fluoroscopic imaging as above. Please refer to the operative report for full details. Electronically signed by: Oneil Devonshire MD 08/19/2024 09:04 PM EST RP Workstation: [id number]   DG Thoracic Spine 1 View Result Date: 08/19/2024 EXAM: 1 LATERAL VIEW XRAY OF THE THORACIC SPINE 08/19/2024 11:52:00 AM COMPARISON: None available. CLINICAL HISTORY: Intraoperative localization. FINDINGS: BONES: Vertebral body heights are maintained. Alignment is normal. The needle is noted overlying what appears to be the T3 vertebral body. Correlate with the intraoperative findings. DISCS AND DEGENERATIVE CHANGES: No severe degenerative changes. SOFT TISSUES: The visualized lungs are clear. IMPRESSION: 1. Needle overlies the T3 vertebral body for intraoperative localization. Electronically signed by: Oneil Devonshire MD 08/19/2024 09:02 PM EST RP Workstation: [id number] C-Arm 1-60 Min-No Report Result Date: 08/19/2024 Fluoroscopy was utilized by the requesting physician.  No radiographic interpretation.   DG C-Arm 1-60 Min-No Report Result Date: 08/19/2024 Fluoroscopy was utilized by the requesting physician.  No radiographic interpretation.  DG C-Arm 1-60 Min-No Report Result Date: 08/19/2024 Fluoroscopy was utilized by the requesting physician.  No radiographic interpretation.   DG C-Arm 1-60 Min-No Report Result Date: 08/19/2024 Fluoroscopy was utilized by the requesting physician.  No radiographic interpretation.   DG C-Arm 1-60 Min-No Report Result Date: 08/19/2024 Fluoroscopy was utilized by the requesting physician.  No radiographic interpretation.   DG C-Arm 1-60 Min-No Report Result Date: 08/19/2024 Fluoroscopy was utilized by the requesting physician.  No radiographic interpretation.     Scheduled Meds:  dexamethasone  (DECADRON ) injection  4 mg Intravenous Q6H   docusate sodium   100 mg Oral BID   pantoprazole   40 mg Oral Daily   polyethylene glycol  17 g Oral Daily   senna  1 tablet  Oral BID   sodium chloride  flush  3 mL Intravenous Q12H   Continuous Infusions:  sodium chloride        LOS: 5 days    Joette Pebbles, MD [facility]   If 7PM-7AM, please contact night-coverage [email or web address]  08/21/2024, 10:14 AM   "

## 2024-08-21 NOTE — Progress Notes (Signed)
 Patient ID: Andre Donovan, male   DOB: 08-24-1965, 59 y.o.   MRN: [id number] Vital signs are stable Patient is feeling better legs strength improving. Drainage has been minimal in the last 24 hours.  I remove the drain and changed the dressing.  It appears largely to be dry at this time.  Paint incision with Betadine will change dressings daily.  Continue mobilization.

## 2024-08-21 NOTE — Plan of Care (Signed)
  Problem: Education: Goal: Knowledge of General Education information will improve Description: Including pain rating scale, medication(s)/side effects and non-pharmacologic comfort measures Outcome: Progressing   Problem: Health Behavior/Discharge Planning: Goal: Ability to manage health-related needs will improve Outcome: Progressing   Problem: Clinical Measurements: Goal: Ability to maintain clinical measurements within normal limits will improve Outcome: Progressing Goal: Will remain free from infection Outcome: Progressing Goal: Diagnostic test results will improve Outcome: Progressing Goal: Respiratory complications will improve Outcome: Progressing Goal: Cardiovascular complication will be avoided Outcome: Progressing   Problem: Activity: Goal: Risk for activity intolerance will decrease Outcome: Progressing   Problem: Nutrition: Goal: Adequate nutrition will be maintained Outcome: Progressing   Problem: Coping: Goal: Level of anxiety will decrease Outcome: Progressing   Problem: Elimination: Goal: Will not experience complications related to bowel motility Outcome: Progressing Goal: Will not experience complications related to urinary retention Outcome: Progressing   Problem: Pain Managment: Goal: General experience of comfort will improve and/or be controlled Outcome: Progressing   Problem: Safety: Goal: Ability to remain free from injury will improve Outcome: Progressing   Problem: Skin Integrity: Goal: Risk for impaired skin integrity will decrease Outcome: Progressing   Problem: Education: Goal: Ability to verbalize activity precautions or restrictions will improve Outcome: Progressing Goal: Knowledge of the prescribed therapeutic regimen will improve Outcome: Progressing Goal: Understanding of discharge needs will improve Outcome: Progressing   Problem: Activity: Goal: Ability to avoid complications of mobility impairment will  improve Outcome: Progressing Goal: Ability to tolerate increased activity will improve Outcome: Progressing Goal: Will remain free from falls Outcome: Progressing   Problem: Bowel/Gastric: Goal: Gastrointestinal status for postoperative course will improve Outcome: Progressing   Problem: Clinical Measurements: Goal: Ability to maintain clinical measurements within normal limits will improve Outcome: Progressing Goal: Postoperative complications will be avoided or minimized Outcome: Progressing Goal: Diagnostic test results will improve Outcome: Progressing   Problem: Pain Management: Goal: Pain level will decrease Outcome: Progressing   Problem: Skin Integrity: Goal: Will show signs of wound healing Outcome: Progressing   Problem: Health Behavior/Discharge Planning: Goal: Identification of resources available to assist in meeting health care needs will improve Outcome: Progressing   Problem: Bladder/Genitourinary: Goal: Urinary functional status for postoperative course will improve Outcome: Progressing   Problem: Education: Goal: Ability to verbalize activity precautions or restrictions will improve Outcome: Progressing Goal: Knowledge of the prescribed therapeutic regimen will improve Outcome: Progressing Goal: Understanding of discharge needs will improve Outcome: Progressing   Problem: Activity: Goal: Ability to avoid complications of mobility impairment will improve Outcome: Progressing Goal: Ability to tolerate increased activity will improve Outcome: Progressing Goal: Will remain free from falls Outcome: Progressing   Problem: Bowel/Gastric: Goal: Gastrointestinal status for postoperative course will improve Outcome: Progressing   Problem: Clinical Measurements: Goal: Ability to maintain clinical measurements within normal limits will improve Outcome: Progressing Goal: Postoperative complications will be avoided or minimized Outcome:  Progressing Goal: Diagnostic test results will improve Outcome: Progressing   Problem: Pain Management: Goal: Pain level will decrease Outcome: Progressing   Problem: Skin Integrity: Goal: Will show signs of wound healing Outcome: Progressing   Problem: Health Behavior/Discharge Planning: Goal: Identification of resources available to assist in meeting health care needs will improve Outcome: Progressing   Problem: Bladder/Genitourinary: Goal: Urinary functional status for postoperative course will improve Outcome: Progressing

## 2024-08-21 NOTE — Progress Notes (Signed)
 Physical Therapy Treatment Patient Details Name: Andre Donovan MRN: [id number] DOB: [birth year] Today's Date: 08/21/2024   History of Present Illness Pt is a 59 y.o male presenting to [facility] 1/26 after Oncology MD recommended ED visit after PET scan showed T5 lesion. 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 Transferred to [facility] for neurosurgery consult. S/p T3-T7 fusion with screws and T5 tumor resection 1/29. PMH: kidney clear-cell renal carcinoma, pneumonia   PT Comments  Pt greeted supine in bed, pleasant and agreeable to PT session. He recalled 3/3 back precautions and adhered to them throughout mobility. Pt increased gait distance, ambulating ~559ft with a reciprocal gait pattern using RW. He completed 2 steps three times with unilateral UE support. Overall, pt was supervision-modI for functional mobility. He reports completing seated BLE exercises. Provided him with a standing exercise handout. Emphasized walking over all exercises. Encouraged OOB<>chair 3x/day and frequent walks with staff assist.      If plan is discharge home, recommend the following: A little help with bathing/dressing/bathroom;Assistance with cooking/housework;Assist for transportation;Help with stairs or ramp for entrance   Can travel by private vehicle        Equipment Recommendations  Rolling walker (2 wheels)    Recommendations for Other Services       Precautions / Restrictions Precautions Precautions: Fall;Back Precaution Booklet Issued: Yes (comment) Recall of Precautions/Restrictions: Intact Precaution/Restrictions Comments: No brace needed Restrictions Weight Bearing Restrictions Per Provider Order: No     Mobility  Bed Mobility Overal bed mobility: Modified Independent             General bed mobility comments: Pt utilized log roll technique to get back into bed. HOB elevated.    Transfers Overall transfer  level: Needs assistance Equipment used: Rolling walker (2 wheels) Transfers: Sit to/from Stand Sit to Stand: Modified independent (Device/Increase time)           General transfer comment: Pt stood from recliner chair. He demonstrated proper hand placement using RW. Powered up without physical assist. Good eccentric control.    Ambulation/Gait Ambulation/Gait assistance: Supervision Gait Distance (Feet): 500 Feet Assistive device: Rolling walker (2 wheels) Gait Pattern/deviations: Step-through pattern, Decreased stride length Gait velocity: reduced Gait velocity interpretation: <1.8 ft/sec, indicate of risk for recurrent falls   General Gait Details: Pt maintained upright posture with good proximity to RW. He demonstrated a heel-to-toe gait pattern with good foot clearence. Pt navigated room/hallway well, no LOB.   Stairs Stairs: Yes Stairs assistance: Supervision Stair Management: One rail Right, Forwards, Step to pattern, Alternating pattern Number of Stairs: 2 (x3) General stair comments: Pt alternated between taking one step at a time and alternating. He c/o BLE weakness/lunsteadiness during descent. Educated pt use a step-to pattern.   Wheelchair Mobility     Tilt Bed    Modified Rankin (Stroke Patients Only)       Balance Overall balance assessment: Needs assistance Sitting-balance support: No upper extremity supported, Feet supported Sitting balance-Leahy Scale: Good     Standing balance support: Bilateral upper extremity supported, During functional activity, Reliant on assistive device for balance Standing balance-Leahy Scale: Poor Standing balance comment: Pt dependent on RW                            Communication Communication Communication: No apparent difficulties  Cognition Arousal: Alert Behavior During Therapy: WFL for tasks assessed/performed   PT -  Cognitive impairments: No apparent impairments                          Following commands: Intact      Cueing Cueing Techniques: Verbal cues  Exercises      General Comments General comments (skin integrity, edema, etc.): Provided pt with standing exercise handout. He reported completing seated exercises including marches and LAQs. Emphasized walking frequently and changing positions.      Pertinent Vitals/Pain Pain Assessment Pain Assessment: No/denies pain (Pt c/o stiffness)    Home Living                          Prior Function            PT Goals (current goals can now be found in the care plan section) Acute Rehab PT Goals Patient Stated Goal: Return Home PT Goal Formulation: With patient/family Time For Goal Achievement: 09/03/24 Potential to Achieve Goals: Good Progress towards PT goals: Progressing toward goals    Frequency    Min 2X/week      PT Plan      Co-evaluation              AM-PAC PT 6 Clicks Mobility   Outcome Measure  Help needed turning from your back to your side while in a flat bed without using bedrails?: None Help needed moving from lying on your back to sitting on the side of a flat bed without using bedrails?: None Help needed moving to and from a bed to a chair (including a wheelchair)?: None Help needed standing up from a chair using your arms (e.g., wheelchair or bedside chair)?: None Help needed to walk in hospital room?: None Help needed climbing 3-5 steps with a railing? : A Little 6 Click Score: 23    End of Session Equipment Utilized During Treatment: Gait belt Activity Tolerance: Patient tolerated treatment well Patient left: in bed;with call bell/phone within reach;with bed alarm set Nurse Communication: Mobility status PT Visit Diagnosis: Difficulty in walking, not elsewhere classified (R26.2);Other abnormalities of gait and mobility (R26.89)     Time: 1209-1225 PT Time Calculation (min) (ACUTE ONLY): 16 min  Charges:    $Gait Training: 8-22 mins PT General  Charges $$ ACUTE PT VISIT: 1 Visit                     Randall SAUNDERS, PT, DPT Acute Rehabilitation Services Office: [phone] Secure Chat Preferred  [name] 08/21/2024, 1:05 PM

## 2024-08-21 NOTE — Care Management (Signed)
 SDOH resources added to AVS

## 2024-08-22 DIAGNOSIS — K5900 Constipation, unspecified: Secondary | ICD-10-CM

## 2024-08-22 MED ORDER — BISACODYL 10 MG RE SUPP: 10.0000 mg | Freq: Once | RECTAL | Status: DC

## 2024-08-22 MED ORDER — DEXAMETHASONE 4 MG PO TABS
4.0000 mg | ORAL_TABLET | Freq: Three times a day (TID) | ORAL | Status: DC
  Administered 2024-08-22 – 2024-08-23 (×2): 4 mg via ORAL
  Filled 2024-08-22 (×2): qty 1

## 2024-08-22 NOTE — Progress Notes (Signed)
" °  °  Providing Compassionate, Quality Care - Together   NEUROSURGERY PROGRESS NOTE     S: No issues overnight.    O: EXAM:  BP 110/82 (BP Location: Right Arm)   Pulse 70   Temp 97.9 F (36.6 C) (Oral)   Resp 15   Ht (P) 6' (1.829 m)   Wt (P) 97.5 kg   SpO2 99%   BMI (P) 29.16 kg/m     Awake, alert, oriented  Speech fluent, appropriate  CNs grossly intact  MAEs Dressing c/d/i   ASSESSMENT:  59 y.o. with pathologic fx T5 s/p T3-T7 PSF for stabilization, path pending    PLAN: -Continue supportive care -Appropriate for dc from NSGY standpoint, defer to primary/oncology -Call w/ questions/concerns.   Camie Pickle, PAC  "

## 2024-08-22 NOTE — Progress Notes (Signed)
 " PROGRESS NOTE    Andre Donovan  FMW:[id number] DOB: 1966/03/25 DOA: 08/16/2024 PCP: Orpha Yancey LABOR, MD   Brief Narrative: 59 year old with past medical history significant for renal cell carcinoma diagnosed October 2024 after he had presented to the hospital with pneumonia, CT scan chest incidentally showed right kidney mass subsequently underwent MRI that showed right renal mass.  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  Since then patient has been on surveillance by oncology. He reports numbness and tingling bilateral leg since Christmas gait problems, back pain.  Oncologist subsequently ordered a PET scan on 08/02/2024.  PET scan with finding of multifocal osseous lesions concerning for metastatic disease, concerning for eminent cord compression, patient was referred to the emergency department for further evaluation. MRI thoracic spine with and without contrast showed x-ray versus extension of vertebral metastatic disease at T5 resulting in severe spinal canal stenosis and spinal cord compression with edema.  Severe right T5-T6 foraminal narrowing.  Multifocal osseous metastatic disease throughout the spine including C6, T2, T6, T7, T8, T10. Case was discussed with neurosurgeon on-call Dr. Gillie and recommendation was to transfer to [facility] for further evaluation.   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 Patient underwent MRI spine which raised concern for severe spinal canal stenosis and cord compression.   Neurosurgery was consulted.  Patient underwent decompression surgery on 08/19/2024 Patient is on dexamethasone  6 mg every 6 hours. Patient also seen by radiation oncology with plans for radiation treatment after he has recovered from surgery.   Simulation on 08/27/2024 with plans to begin radiation on 09/06/2024. PT OT evaluation. Discharge when cleared by neurosurgery. Leukocytosis likely due to steroids.    Metastatic renal cell carcinoma Patient's oncologist, Dr. Davonna, was notified of patient's hospitalization. See above.    Constipation Has not had a bowel movement despite treatment with MiraLAX  and Senokot.  Will order suppository today.     DVT prophylaxis: Lovenox  Code Status: Full code Family Communication: Care discussed with patient Disposition Plan: Home with home health is recommended   Consultants:  Dr. Gillie Dr. Estefana Cha  radiation oncologist  Procedures:  none  Subjective: Feels well.  More confident about ambulating now.  Denies any significant back pain.  No nausea vomiting.  Still has not had a bowel movement.  Denies any abdominal pain  Objective: Vitals:   08/21/24 2109 08/22/24 0000 08/22/24 0500 08/22/24 0911  BP: 123/82 128/85 110/82 117/75  Pulse: 79 72 70 77  Resp: 17 16 15 18   Temp: 97.9 F (36.6 C) 98.1 F (36.7 C) 97.9 F (36.6 C) (!) 97.4 F (36.3 C)  TempSrc: Oral Oral Oral Oral  SpO2: 98% 99% 99% 99%  Weight:      Height:        Intake/Output Summary (Last 24 hours) at 08/22/2024 0920 Last data filed at 08/21/2024 2200 Gross per 24 hour  Intake 480 ml  Output --  Net 480 ml    Filed Weights   08/16/24 1235 08/19/24 0703  Weight: 96.6 kg (P) 97.5 kg    Examination:  General appearance: Awake alert.  In no distress Resp: Clear to auscultation bilaterally.  Normal effort Cardio: S1-S2 is normal regular.  No S3-S4.  No rubs murmurs or bruit GI: Abdomen is soft.  Nontender nondistended.  Bowel sounds are present normal.  No masses organomegaly   Data Reviewed: I have personally reviewed following labs and reports of imaging studies  CBC: Recent Labs  Lab 08/16/24 1316 08/17/24 0532 08/19/24 1236 08/20/24 0202 08/21/24 0644  WBC 10.9* 5.6  --  15.3*  14.7*  NEUTROABS 8.3*  --   --   --   --   HGB 16.3 17.0 15.0 14.1 15.0  HCT 48.1 48.8 44.0 41.3 42.6  MCV 93.2 91.4  --  92.4 90.3  PLT 186 156  --  175 166   Basic Metabolic Panel: Recent Labs  Lab 08/16/24 1316 08/17/24 0532 08/19/24 1236 08/20/24 0202 08/20/24 1042 08/21/24 0644  NA 140 135 138 135  --  135  K 4.0 4.6 4.9 5.3* 4.6 4.5  CL 104 101  --  100  --  101  CO2 22 23  --  25  --  27  GLUCOSE 90 137*  --  133*  --  140*  BUN 17 18  --  19  --  18  CREATININE 0.82 0.86  --  0.85  --  0.65  CALCIUM 9.4 9.8  --  8.9  --  9.1   GFR: Estimated Creatinine Clearance: 118.3 mL/min (by C-G formula based on SCr of 0.65 mg/dL).   Recent Results (from the past 240 hours)  Surgical pcr screen     Status: None   Collection Time: 08/17/24 10:39 PM   Specimen: Nasal Mucosa; Nasal Swab  Result Value Ref Range Status   MRSA, PCR NEGATIVE NEGATIVE Final   Staphylococcus aureus NEGATIVE NEGATIVE Final    Comment: (NOTE) The Xpert SA Assay (FDA approved for NASAL specimens in patients 78 years of age and older), is one component of a comprehensive surveillance program. It is not intended to diagnose infection nor to guide or monitor treatment. Performed at [facility], [address] [place], [place] [address]       Radiology Studies: No results found.    Scheduled Meds:  bisacodyl   10 mg Rectal Once   dexamethasone  (DECADRON ) injection  4 mg Intravenous Q6H   docusate sodium   100 mg Oral BID   pantoprazole   40 mg Oral Daily   polyethylene glycol  17 g Oral BID   senna  1 tablet Oral BID   sodium chloride  flush  3 mL Intravenous Q12H   Continuous Infusions:     LOS: 6 days    Joette Pebbles, MD [facility]   If 7PM-7AM, please contact night-coverage [email or web address]  08/22/2024, 9:20 AM   "

## 2024-08-23 ENCOUNTER — Other Ambulatory Visit (HOSPITAL_COMMUNITY): Payer: Self-pay

## 2024-08-23 LAB — TYPE AND SCREEN
ABO/RH(D): O POS
Antibody Screen: NEGATIVE
Unit division: 0
Unit division: 0

## 2024-08-23 LAB — BASIC METABOLIC PANEL WITH GFR
Anion gap: 9 (ref 5–15)
BUN: 25 mg/dL — ABNORMAL HIGH (ref 6–20)
CO2: 26 mmol/L (ref 22–32)
Calcium: 8.9 mg/dL (ref 8.9–10.3)
Chloride: 100 mmol/L (ref 98–111)
Creatinine, Ser: 0.71 mg/dL (ref 0.61–1.24)
GFR, Estimated: >60 mL/min
Glucose, Bld: 143 mg/dL — ABNORMAL HIGH (ref 70–99)
Potassium: 4.6 mmol/L (ref 3.5–5.1)
Sodium: 134 mmol/L — ABNORMAL LOW (ref 135–145)

## 2024-08-23 LAB — BPAM RBC
Blood Product Expiration Date: 202602212359
Blood Product Expiration Date: 202602222359
ISSUE DATE / TIME: 202601221750
Unit Type and Rh: 5100
Unit Type and Rh: 5100

## 2024-08-23 LAB — TSH: TSH: 0.436 u[IU]/mL (ref 0.350–4.500)

## 2024-08-23 LAB — MAGNESIUM: Magnesium: 2.1 mg/dL (ref 1.7–2.4)

## 2024-08-23 LAB — SURGICAL PATHOLOGY

## 2024-08-23 MED ORDER — POLYETHYLENE GLYCOL 3350 17 GM/SCOOP PO POWD
17.0000 g | Freq: Two times a day (BID) | ORAL | 0 refills | Status: AC
  Filled 2024-08-23: qty 476, 14d supply, fill #0

## 2024-08-23 MED ORDER — PANTOPRAZOLE SODIUM 40 MG PO TBEC
40.0000 mg | DELAYED_RELEASE_TABLET | Freq: Every day | ORAL | 0 refills | Status: AC
  Filled 2024-08-23: qty 30, 30d supply, fill #0

## 2024-08-23 MED ORDER — SENNOSIDES-DOCUSATE SODIUM 8.6-50 MG PO TABS
2.0000 | ORAL_TABLET | Freq: Every day | ORAL | 0 refills | Status: AC
  Filled 2024-08-23: qty 60, 30d supply, fill #0

## 2024-08-23 MED ORDER — DEXAMETHASONE 4 MG PO TABS
4.0000 mg | ORAL_TABLET | Freq: Three times a day (TID) | ORAL | 0 refills | Status: AC
  Filled 2024-08-23: qty 90, 30d supply, fill #0

## 2024-08-23 MED ORDER — OXYCODONE HCL 5 MG PO TABS
5.0000 mg | ORAL_TABLET | Freq: Four times a day (QID) | ORAL | 0 refills | Status: AC | PRN
  Filled 2024-08-23: qty 20, 5d supply, fill #0

## 2024-08-23 NOTE — Plan of Care (Signed)
  Problem: Education: Goal: Knowledge of General Education information will improve Description: Including pain rating scale, medication(s)/side effects and non-pharmacologic comfort measures Outcome: Progressing   Problem: Health Behavior/Discharge Planning: Goal: Ability to manage health-related needs will improve Outcome: Progressing   Problem: Clinical Measurements: Goal: Ability to maintain clinical measurements within normal limits will improve Outcome: Progressing Goal: Will remain free from infection Outcome: Progressing Goal: Diagnostic test results will improve Outcome: Progressing Goal: Respiratory complications will improve Outcome: Progressing Goal: Cardiovascular complication will be avoided Outcome: Progressing   Problem: Activity: Goal: Risk for activity intolerance will decrease Outcome: Progressing   Problem: Nutrition: Goal: Adequate nutrition will be maintained Outcome: Progressing   Problem: Coping: Goal: Level of anxiety will decrease Outcome: Progressing   Problem: Elimination: Goal: Will not experience complications related to bowel motility Outcome: Progressing Goal: Will not experience complications related to urinary retention Outcome: Progressing   Problem: Pain Managment: Goal: General experience of comfort will improve and/or be controlled Outcome: Progressing   Problem: Safety: Goal: Ability to remain free from injury will improve Outcome: Progressing   Problem: Skin Integrity: Goal: Risk for impaired skin integrity will decrease Outcome: Progressing   Problem: Education: Goal: Ability to verbalize activity precautions or restrictions will improve Outcome: Progressing Goal: Knowledge of the prescribed therapeutic regimen will improve Outcome: Progressing Goal: Understanding of discharge needs will improve Outcome: Progressing   Problem: Activity: Goal: Ability to avoid complications of mobility impairment will  improve Outcome: Progressing Goal: Ability to tolerate increased activity will improve Outcome: Progressing Goal: Will remain free from falls Outcome: Progressing   Problem: Bowel/Gastric: Goal: Gastrointestinal status for postoperative course will improve Outcome: Progressing   Problem: Clinical Measurements: Goal: Ability to maintain clinical measurements within normal limits will improve Outcome: Progressing Goal: Postoperative complications will be avoided or minimized Outcome: Progressing Goal: Diagnostic test results will improve Outcome: Progressing   Problem: Pain Management: Goal: Pain level will decrease Outcome: Progressing   Problem: Skin Integrity: Goal: Will show signs of wound healing Outcome: Progressing   Problem: Health Behavior/Discharge Planning: Goal: Identification of resources available to assist in meeting health care needs will improve Outcome: Progressing   Problem: Bladder/Genitourinary: Goal: Urinary functional status for postoperative course will improve Outcome: Progressing   Problem: Education: Goal: Ability to verbalize activity precautions or restrictions will improve Outcome: Progressing Goal: Knowledge of the prescribed therapeutic regimen will improve Outcome: Progressing Goal: Understanding of discharge needs will improve Outcome: Progressing   Problem: Activity: Goal: Ability to avoid complications of mobility impairment will improve Outcome: Progressing Goal: Ability to tolerate increased activity will improve Outcome: Progressing Goal: Will remain free from falls Outcome: Progressing   Problem: Bowel/Gastric: Goal: Gastrointestinal status for postoperative course will improve Outcome: Progressing   Problem: Clinical Measurements: Goal: Ability to maintain clinical measurements within normal limits will improve Outcome: Progressing Goal: Postoperative complications will be avoided or minimized Outcome:  Progressing Goal: Diagnostic test results will improve Outcome: Progressing   Problem: Pain Management: Goal: Pain level will decrease Outcome: Progressing   Problem: Skin Integrity: Goal: Will show signs of wound healing Outcome: Progressing   Problem: Health Behavior/Discharge Planning: Goal: Identification of resources available to assist in meeting health care needs will improve Outcome: Progressing   Problem: Bladder/Genitourinary: Goal: Urinary functional status for postoperative course will improve Outcome: Progressing

## 2024-08-23 NOTE — Plan of Care (Signed)
 " Problem: Education: Goal: Knowledge of General Education information will improve Description: Including pain rating scale, medication(s)/side effects and non-pharmacologic comfort measures Outcome: Adequate for Discharge   Problem: Health Behavior/Discharge Planning: Goal: Ability to manage health-related needs will improve Outcome: Adequate for Discharge   Problem: Clinical Measurements: Goal: Ability to maintain clinical measurements within normal limits will improve Outcome: Adequate for Discharge Goal: Will remain free from infection Outcome: Adequate for Discharge Goal: Diagnostic test results will improve Outcome: Adequate for Discharge Goal: Respiratory complications will improve Outcome: Adequate for Discharge Goal: Cardiovascular complication will be avoided Outcome: Adequate for Discharge   Problem: Activity: Goal: Risk for activity intolerance will decrease Outcome: Adequate for Discharge   Problem: Nutrition: Goal: Adequate nutrition will be maintained Outcome: Adequate for Discharge   Problem: Coping: Goal: Level of anxiety will decrease Outcome: Adequate for Discharge   Problem: Elimination: Goal: Will not experience complications related to bowel motility Outcome: Adequate for Discharge Goal: Will not experience complications related to urinary retention Outcome: Adequate for Discharge   Problem: Pain Managment: Goal: General experience of comfort will improve and/or be controlled Outcome: Adequate for Discharge   Problem: Safety: Goal: Ability to remain free from injury will improve Outcome: Adequate for Discharge   Problem: Skin Integrity: Goal: Risk for impaired skin integrity will decrease Outcome: Adequate for Discharge   Problem: Education: Goal: Ability to verbalize activity precautions or restrictions will improve Outcome: Adequate for Discharge Goal: Knowledge of the prescribed therapeutic regimen will improve Outcome: Adequate for  Discharge Goal: Understanding of discharge needs will improve Outcome: Adequate for Discharge   Problem: Activity: Goal: Ability to avoid complications of mobility impairment will improve Outcome: Adequate for Discharge Goal: Ability to tolerate increased activity will improve Outcome: Adequate for Discharge Goal: Will remain free from falls Outcome: Adequate for Discharge   Problem: Bowel/Gastric: Goal: Gastrointestinal status for postoperative course will improve Outcome: Adequate for Discharge   Problem: Clinical Measurements: Goal: Ability to maintain clinical measurements within normal limits will improve Outcome: Adequate for Discharge Goal: Postoperative complications will be avoided or minimized Outcome: Adequate for Discharge Goal: Diagnostic test results will improve Outcome: Adequate for Discharge   Problem: Pain Management: Goal: Pain level will decrease Outcome: Adequate for Discharge   Problem: Skin Integrity: Goal: Will show signs of wound healing Outcome: Adequate for Discharge   Problem: Health Behavior/Discharge Planning: Goal: Identification of resources available to assist in meeting health care needs will improve Outcome: Adequate for Discharge   Problem: Bladder/Genitourinary: Goal: Urinary functional status for postoperative course will improve Outcome: Adequate for Discharge   Problem: Education: Goal: Ability to verbalize activity precautions or restrictions will improve Outcome: Adequate for Discharge Goal: Knowledge of the prescribed therapeutic regimen will improve Outcome: Adequate for Discharge Goal: Understanding of discharge needs will improve Outcome: Adequate for Discharge   Problem: Activity: Goal: Ability to avoid complications of mobility impairment will improve Outcome: Adequate for Discharge Goal: Ability to tolerate increased activity will improve Outcome: Adequate for Discharge Goal: Will remain free from falls Outcome:  Adequate for Discharge   Problem: Bowel/Gastric: Goal: Gastrointestinal status for postoperative course will improve Outcome: Adequate for Discharge   Problem: Clinical Measurements: Goal: Ability to maintain clinical measurements within normal limits will improve Outcome: Adequate for Discharge Goal: Postoperative complications will be avoided or minimized Outcome: Adequate for Discharge Goal: Diagnostic test results will improve Outcome: Adequate for Discharge   Problem: Pain Management: Goal: Pain level will decrease Outcome: Adequate for Discharge   Problem: Skin  Integrity: Goal: Will show signs of wound healing Outcome: Adequate for Discharge   Problem: Health Behavior/Discharge Planning: Goal: Identification of resources available to assist in meeting health care needs will improve Outcome: Adequate for Discharge   Problem: Bladder/Genitourinary: Goal: Urinary functional status for postoperative course will improve Outcome: Adequate for Discharge   "

## 2024-08-26 ENCOUNTER — Inpatient Hospital Stay: Payer: Self-pay | Attending: Physician Assistant | Admitting: Oncology

## 2024-08-26 ENCOUNTER — Telehealth: Payer: Self-pay

## 2024-08-26 ENCOUNTER — Other Ambulatory Visit (HOSPITAL_COMMUNITY): Payer: Self-pay

## 2024-08-26 ENCOUNTER — Telehealth: Payer: Self-pay | Admitting: Pharmacy Technician

## 2024-08-26 VITALS — BP 129/80 | HR 89 | Temp 97.5°F | Resp 18 | Ht 72.0 in | Wt 210.5 lb

## 2024-08-26 DIAGNOSIS — C641 Malignant neoplasm of right kidney, except renal pelvis: Secondary | ICD-10-CM | POA: Insufficient documentation

## 2024-08-26 DIAGNOSIS — C7951 Secondary malignant neoplasm of bone: Secondary | ICD-10-CM

## 2024-08-26 DIAGNOSIS — Z720 Tobacco use: Secondary | ICD-10-CM

## 2024-08-26 DIAGNOSIS — Z7189 Other specified counseling: Secondary | ICD-10-CM

## 2024-08-26 MED ORDER — AXITINIB 5 MG PO TABS: 5.0000 mg | ORAL_TABLET | Freq: Two times a day (BID) | ORAL | 3 refills | Status: AC

## 2024-08-26 NOTE — Telephone Encounter (Signed)
 Oral Oncology Patient Advocate Encounter   Began application for assistance for Inlyta  through [employer].   Application will be submitted upon completion of necessary supporting documentation.   [employer] [facility] phone number [phone].  Manufacturing Systems Engineer stated that patient must apply for Medicaid and be denied before he can be considered for PAP.   I called and spoke with patient and advised him to apply for Medicaid.  He knows to call me back once he receives a determination letter/notice from [facility].  Once I have the determination letter/notice in hand then I will continue obtaining required information for PAP if appropriate.   Omelia Marquart (Patty) Chet Burnet, CPhT  [facility], Zelda Salmon, [facility] - Oral Chemotherapy Pharmacy Technician III Certified Patient Advocate Phone: [phone]  Fax: [phone]

## 2024-08-26 NOTE — Progress Notes (Signed)
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

## 2024-08-26 NOTE — Telephone Encounter (Incomplete)
 PGY2 Oncology Pharmacy Resident Encounter - Oral Chemo Assessment  Received new prescription for Inlyta  (axitinib ) for the treatment of renal cell carcinoma in conjunction with pembrolizumab, planned duration until disease progression or intolerable toxicity.  Oral Chemo Regimen  Medication/dosing schedule: Inlyta  (axitinib ) take 1 tablet (5 mg) by mouth two times daily.  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  REMs program: No  Prescription dose and frequency assessed: Yes   Labs/Baseline Monitoring   Required baseline labs: CBC, CMP Labs from 08/23/2024 (CMP) & 08/21/2024 (CBC) assessed, see below.  Renal/hepatic function: LFTs and tbili WNL, renal function > 60 mL/min   Plan: No dose reductions indicated at this time   Current Medications  Current medication list reviewed in Epic 1 DDIs associated with axitinib  identified Actions required: Dexamethasone : Category C, Monitor therapy; potential to reduce axitinib  efficacy during coadministration   Recommendations: No action at this time. Dexamethasone  due spinal cord compression. To be tapered per oncologist.   Supportive Care Needs No:  Nausea/Vomiting (Low minimal Risk) Ondansetron  8 mg every 8 hours PRN per treatment plan Compazine 10 mg every 6 hours PRN per treatment plan   Logistics and Coordination  Evaluated chart and patient is uninsured so will require patient assistance for access to medications.  Prescription has been e-scribed to the [facility] for benefits analysis and approval. Oral Oncology Clinic will continue to follow for insurance authorization, copayment issues, initial counseling and start date.  Thank you for allowing pharmacy to participate in this patient's care.  Alfonso MARLA Buys, PharmD Pharmacy Resident  08/26/2024 1:19 PM

## 2024-08-26 NOTE — Patient Instructions (Signed)
 [facility] Discharge Instructions   You were seen and examined today by Dr. Davonna.  She reviewed the results of your lab work which are normal/stable.   She reviewed the results of your PET scan that shows the kidney cancer you had has returned and spread to multiple areas in the bones. This makes this cancer a Stage IV cancer, which is treatable but not curable. You will need to be on some form of treatment to control the cancer indefinitely.   We will start you on treatment for your renal cell carcinoma. You will receive an immunotherapy infusion in the clinic every 3 weeks. The name of the drug is Keytruda. This medication works with your body's own immune system to help target and kill cancer cells. The other part of your treatment will be a pill you take at home. The name of this pill is called Inlyta . You take this medication twice a day every day. This medication will come from a specialty pharmacy and be delivered to your home. A pharmacist from the specialty pharmacy will call you to discuss this medication and side effects in detail, and also will arrange delivery of this drug. We will send this prescription for the pills today. DO NOT start taking them until your first infusion here in the clinic.   We will plan to start you on treatment the first week of March.   Return as scheduled.    Thank you for choosing [facility] to provide your oncology and hematology care.  To afford each patient quality time with our provider, please arrive at least 15 minutes before your scheduled appointment time.   If you have a lab appointment with the Cancer Center please come in thru the Main Entrance and check in at the main information desk.  You need to re-schedule your appointment should you arrive 10 or more minutes late.  We strive to give you quality time with our providers, and arriving late affects you and other  patients whose appointments are after yours.  Also, if you no show three or more times for appointments you may be dismissed from the clinic at the providers discretion.     Again, thank you for choosing [facility].  Our hope is that these requests will decrease the amount of time that you wait before being seen by our physicians.       _____________________________________________________________  Should you have questions after your visit to [facility], please contact our office at [phone] and follow the prompts.  Our office hours are 8:00 a.m. and 4:30 p.m. Monday - Friday.  Please note that voicemails left after 4:00 p.m. may not be returned until the following business day.  We are closed weekends and major holidays.  You do have access to a nurse 24-7, just call the main number to the clinic [phone] and do not press any options, hold on the line and a nurse will answer the phone.    For prescription refill requests, have your pharmacy contact our office and allow 72 hours.    Due to Covid, you will need to wear a mask upon entering the hospital. If you do not have a mask, a mask will be given to you at the Main Entrance upon arrival. For doctor visits, patients may have 1 support person age 70 or older with them. For treatment visits, patients can not have anyone  with them due to social distancing guidelines and our immunocompromised population.

## 2024-08-26 NOTE — Progress Notes (Signed)
 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 male was transferred to my care after his prior physician has left.   ASSESSMENT & PLAN:   Assessment & Plan: Andre Donovan  is a 59 y.o. male with metastatic RCC  Assessment and Plan  Metastatic RCC Oncology history below Recent PET scan with recurrence of disease and metastatic S/p cord compression surgery and planned for RT later this month  - We discussed the diagnosis and prognosis in detail.  Patient does understand that treatment is with a palliative intent-to live longer with a good quality of life and to have the cancer under control - Discussed the systemic treatment options.  Standard of care is the use of TKI along with immunotherapy. -Discussed treatment with axitinib  5 mg twice daily and pembrolizumab 200 mg every 3 weeks until intolerance or disease progression. - We discussed risk versus benefits in detail including side effects. -Axitinib  combined with pembrolizumab commonly causes gastrointestinal and constitutional symptoms such as diarrhea, fatigue, decreased appetite, nausea, and hypertension, along with liver enzyme elevations and hypothyroidism.Pembrolizumabs immune-mediated effects can also trigger endocrinopathies (like thyroid disorders) and other immune reactions, while axitinib  contributes to vascular-related toxicities and skin changes. -Keynote 426 trial demonstrated superior outcomes with this combination compared to get up with an overall objective response rate of 60.6% versus 39.6% with sunitinib.  The 60 month OS was 41.9%. -Will send [facility] NGS on the tissue - Will start the first  week of March but is 4 weeks after the surgery. - Repeat scans after 4 treatments of Keytruda.  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 Would recommend tapering off prior to start of immunotherapy -Follow up with neurosurgery and Rad/Onc  Tobacco use Patient smokes about a pack per day and is trying to cut down  -Encouraged the attempts to cut down smoking  Goals of care Patient understands his prognosis and palliative nature of his treatment.  Patient prefers to be full code at this time  The patient understands the plans discussed today and is in agreement with them.  He knows to contact our office if he develops concerns prior to his next appointment.  55 minutes of total time was spent for this patient encounter, including preparation,review of records,  face-to-face counseling with the patient and coordination of care, physical exam, and documentation of the encounter.    LILLETTE Verneta SAUNDERS Teague,acting as a neurosurgeon for Donovan Davonna, MD.,have documented all relevant documentation on the behalf of Donovan Davonna, MD,as directed by  Donovan Davonna, MD while in the presence of Donovan Davonna, MD.  I, Donovan Davonna MD, have reviewed the above documentation for accuracy and completeness, and I agree with the above.    Donovan Davonna, MD  [facility] [place] - A DEPT OF Andre Donovan [facility] [address] [place] [address] Dept: [phone] Dept Fax: [phone]   No orders of the defined types were placed in this encounter.    ONCOLOGY HISTORY:   I have reviewed his chart and materials related  to his cancer extensively and collaborated history with the patient. Summary of oncologic history is as follows:   Diagnosis: Right kidney clear-cell renal cell carcinoma   -04/27/2023: CT  chest: Heterogeneously enhancing 3.5 cm mass within the visualized upper pole of the right kidney, incompletely included on this examination but suspicious for a primary renal neoplasm. Extensive tree-in-bud nodularity throughout the right upper lobe and superior segment of the right lower lobe in keeping with atypical infection, inhalational injury, or hypersensitivity pneumonitis though the latter 2 entities are considered less likely given the unilateral nature of the findings. -05/19/2023: MRI abdomen and pelvis: Heterogeneously enhancing mass arising from the medial superior pole of the right kidney measuring 3.8 x 3.4 cm, consistent with renal cell carcinoma. No evidence of renal vein invasion, lymphadenopathy or metastatic disease in the abdomen or pelvis. -08/06/2023: Right kidney mass biopsy and percutaneous cryoablation. Pathology: Renal cell carcinoma, clear cell type, WHO/ISUP grade 2.  -07/23/2024: CT Renal Abdomen: New lytic metastases at L3 and the left iliac bone.  -08/02/2024: Immunofixation shows polyclonal increase detected in one or more immunoglobulins. IgM elevated at 213.  -08/12/2024: Initial PET: Widespread osseous metastatic disease with prominent involvement of the spine, bony pelvis and right scapular glenoid. The largest spinal lesion is expansile within the T5 vertebral body with intraspinal extension, placing the patient at risk for cord compression. No evidence of extra osseous metastatic disease within the lungs or abdomen. Stable appearance of the cryoablation bed in the upper pole of the right kidney. 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 -08/16/2024: MRI Thoracic Spine: Extraosseous extension of vertebral metastatic disease at T5 results in severe spinal canal stenosis and spinal cord compression with edema. Severe right T5-T6 neural foraminal narrowing. Additional multifocal osseous metastatic disease throughout the spine,  including at C6, T2, T6, T7, T8, and T10. -08/16/2024: CT Thoracic Spine: Lytic destructive mass involving the posterior aspect of the T5 vertebral body, with extension into the posterior elements of T4 through T6, consistent with metastatic disease. Epidural extension with secondary severe spinal stenosis and cord compression, better appreciated on prior MRI. Associated severe bilateral foraminal narrowing at T5-6, worse on the right. Multiple additional osseous metastases involving the C7, T2, and T6 vertebral bodies. Previously identified lesion at T8 better seen on prior MRI. Associated mild pathologic compression fracture at T2. -08/19/2024: Thoracic T3 - T7 fusion and T5 tumor resection.  Pathology: Metastatic carcinoma, morphologically consistent with clear cell renal cell carcinoma.   Current Treatment:  Planned Axitinib  + pembrolizumab  INTERVAL HISTORY:   Andre Donovan is here today for follow up and to establish care with me for clear cell RCC. Patient is accompanied by his wife.   Andre Donovan plans to start radiation on 09/06/2024. He is recovering well from surgery and is able to walk with a cane. He is doing physical therapy exercises at home. He previously had right shoulder pain and corresponding with osseous metastases that have resolved.   We discussed the staging of his cancer, duration of chemotherapy and immunotherapy, and side effects of treatment. He has no history of hypertension. Andre Donovan denies any autoimmune conditions and does not have a condition requiring steroid use.   We also discussed goals of care and he is full code at this time.   He lives in [place], [place] with his wife. He is an active smoker of 1 ppd.   I have reviewed the past medical history, past surgical history, social history and family history with  the patient and they are unchanged from previous note.  ALLERGIES:  has no known allergies.  MEDICATIONS:  Current Outpatient Medications  Medication Sig  Dispense Refill   acetaminophen  (TYLENOL ) 500 MG tablet Take 500 mg by mouth every 6 (six) hours as needed for mild pain (pain score 1-3).     axitinib  (INLYTA ) 5 MG tablet Take 1 tablet (5 mg total) by mouth 2 (two) times daily. 60 tablet 3   dexamethasone  (DECADRON ) 4 MG tablet Take 1 tablet (4 mg total) by mouth 3 (three) times daily. Take 3 times a day until your oncologist decides to decrease the dose. 90 tablet 0   oxyCODONE  (OXY IR/ROXICODONE ) 5 MG immediate release tablet Take 1 tablet (5 mg total) by mouth every 6 (six) hours as needed for severe pain (pain score 7-10). 20 tablet 0   pantoprazole  (PROTONIX ) 40 MG tablet Take 1 tablet (40 mg total) by mouth daily. 60 tablet 0   senna-docusate (SENOKOT-S) 8.6-50 MG tablet Take 2 tablets by mouth at bedtime. 60 tablet 0   polyethylene glycol powder (GLYCOLAX /MIRALAX ) 17 GM/SCOOP powder Dissolve 1 capful (17g) in 4-8 ounces of liquid and take by mouth 2 (two) times daily. (Patient not taking: Reported on 08/26/2024) 476 g 0   No current facility-administered medications for this visit.    VITALS:  Blood pressure 129/80, pulse 89, temperature (!) 97.5 F (36.4 C), temperature source Tympanic, resp. rate 18, height 6' (1.829 m), weight 210 lb 8 oz (95.5 kg), SpO2 100%.  Wt Readings from Last 3 Encounters:  08/26/24 210 lb 8 oz (95.5 kg)  08/19/24 (P) 215 lb (97.5 kg)  08/02/24 213 lb (96.6 kg)    Body mass index is 28.55 kg/m.  Performance status (ECOG): 1 - Symptomatic but completely ambulatory  PHYSICAL EXAM:   GENERAL:alert, no distress and comfortable SKIN: skin color, texture, turgor are normal, no rashes or significant lesions LYMPH:  no palpable lymphadenopathy in the cervical, axillary or inguinal LUNGS: clear to auscultation and percussion with normal breathing effort HEART: regular rate & rhythm and no murmurs and no lower extremity edema ABDOMEN:abdomen soft, non-tender and normal bowel sounds Musculoskeletal:no cyanosis  of digits and no clubbing  NEURO: alert & oriented x 3 with fluent speech  LABORATORY DATA:  I have reviewed the data as listed  Lab Results  Component Value Date   WBC 14.7 (H) 08/21/2024   NEUTROABS 8.3 (H) 08/16/2024   HGB 15.0 08/21/2024   HCT 42.6 08/21/2024   MCV 90.3 08/21/2024   PLT 166 08/21/2024     Chemistry      Component Value Date/Time   NA 134 (L) 08/23/2024 0140   K 4.6 08/23/2024 0140   CL 100 08/23/2024 0140   CO2 26 08/23/2024 0140   BUN 25 (H) 08/23/2024 0140   CREATININE 0.71 08/23/2024 0140      Component Value Date/Time   CALCIUM 8.9 08/23/2024 0140   ALKPHOS 121 07/23/2024 1237   AST 29 07/23/2024 1237   ALT 22 07/23/2024 1237   BILITOT 0.9 07/23/2024 1237       RADIOGRAPHIC STUDIES: I have personally reviewed the radiological images as listed and agreed with the findings in the report.  EXAM: MRI THORACIC SPINE WITH AND WITHOUT INTRAVENOUS CONTRAST 08/16/2024 03:20:34 PM   TECHNIQUE: Multiplanar multisequence MRI of the thoracic spine was performed with and without the administration of intravenous contrast.   COMPARISON: FDG PET/CT 08/16/2024.   CLINICAL HISTORY: 59 year old male with abnormal nuclear medicine  scan. Increasing leg numbness.   FINDINGS:   BONES AND ALIGNMENT: There are multifocal T1-hypointense, STIR-hyperintense and enhancing marrow replacing lesions throughout the spine in keeping with diffuse osseous metastatic disease. These include the partially evaluated C6 vertebral body, T2 vertebral body, T5-T8 vertebral bodies, and T10 left posterior element. Most notably, there is extraosseous extension at T5 resulting in near circumferential spinal canal encroachment, severe spinal canal stenosis, and thoracic spinal cord compression. There is associated STIR hyperintensity within the compressed cord at T5 in keeping with edema (series 25, image 12). There is severe narrowing of the right T5-T6 neural foramen, mild  left T5-T6 neural foraminal narrowing, and mild right T4-T5 neural foraminal narrowing as a result as well.   SPINAL CORD: There is otherwise no abnormal intradural or intramedullary spinal cord enhancement. The conus medullaris terminates at the T12-L1 level.   DEGENERATIVE CHANGES: There is multilevel degenerative changes of the thoracic spine. No high grade spinal canal stenosis, except at T5 as described above. Mild left T2-T3 neural foraminal stenosis. At T10-T11, there is mild-to-moderate right neural foraminal stenosis. At T6-C7, there is left asymmetric disc bulge.   INCIDENTAL FINDINGS: Partially evaluated post treatment related changes right kidney. Subcentimeter right hepatic T2 hyperintensity is not fully characterized. Partially evaluated right thyroid nodule measures up to 1.0 cm.   IMPRESSION: 1. 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 This finding was communicated to Dr. Ozell Arts at 4:20 PM. 2. Severe right T5-T6 neural foraminal narrowing. 3. Additional multifocal osseous metastatic disease throughout the spine, including at C6, T2, T6, T7, T8, and T10.   Electronically signed by: Prentice Spade MD 08/16/2024 04:37 PM EST RP Workstation: [id number]    "

## 2024-08-27 ENCOUNTER — Ambulatory Visit: Admission: RE | Admit: 2024-08-27 | Payer: Self-pay | Admitting: Radiation Oncology

## 2024-08-31 ENCOUNTER — Inpatient Hospital Stay: Payer: Self-pay | Attending: Physician Assistant | Admitting: Oncology

## 2024-09-01 ENCOUNTER — Inpatient Hospital Stay: Payer: Self-pay | Admitting: Licensed Clinical Social Worker

## 2024-09-08 ENCOUNTER — Ambulatory Visit: Payer: Self-pay | Admitting: Radiation Oncology

## 2024-09-09 ENCOUNTER — Ambulatory Visit: Payer: Self-pay

## 2024-09-10 ENCOUNTER — Ambulatory Visit: Payer: Self-pay

## 2024-09-13 ENCOUNTER — Ambulatory Visit: Payer: Self-pay

## 2024-09-13 ENCOUNTER — Inpatient Hospital Stay: Payer: Self-pay

## 2024-09-14 ENCOUNTER — Ambulatory Visit: Payer: Self-pay

## 2024-09-15 ENCOUNTER — Ambulatory Visit: Payer: Self-pay

## 2024-09-16 ENCOUNTER — Ambulatory Visit: Payer: Self-pay

## 2024-09-17 ENCOUNTER — Ambulatory Visit: Payer: Self-pay

## 2024-09-20 ENCOUNTER — Ambulatory Visit: Payer: Self-pay

## 2024-09-21 ENCOUNTER — Ambulatory Visit: Payer: Self-pay

## 2024-09-23 ENCOUNTER — Inpatient Hospital Stay: Payer: Self-pay

## 2024-09-23 ENCOUNTER — Inpatient Hospital Stay: Payer: Self-pay | Attending: Physician Assistant

## 2024-09-27 ENCOUNTER — Inpatient Hospital Stay: Payer: Self-pay | Admitting: Dietician

## 2024-10-14 ENCOUNTER — Inpatient Hospital Stay: Payer: Self-pay | Admitting: Oncology

## 2024-10-14 ENCOUNTER — Inpatient Hospital Stay: Payer: Self-pay

## 2025-08-16 ENCOUNTER — Ambulatory Visit: Payer: Self-pay | Admitting: Radiation Oncology
